# Patient Record
Sex: Female | Born: 1970 | Race: White | Hispanic: No | Marital: Married | Smoking: Former smoker
Health system: Southern US, Community
[De-identification: ages and names within clinical notes are randomized; demographics above are authoritative.]

## PROBLEM LIST (undated history)

## (undated) DIAGNOSIS — E7800 Pure hypercholesterolemia, unspecified: Secondary | ICD-10-CM

## (undated) DIAGNOSIS — M2550 Pain in unspecified joint: Secondary | ICD-10-CM

## (undated) DIAGNOSIS — Z800 Family history of malignant neoplasm of digestive organs: Secondary | ICD-10-CM

## (undated) DIAGNOSIS — Z8042 Family history of malignant neoplasm of prostate: Secondary | ICD-10-CM

## (undated) DIAGNOSIS — T8859XA Other complications of anesthesia, initial encounter: Secondary | ICD-10-CM

## (undated) DIAGNOSIS — L111 Transient acantholytic dermatosis [Grover]: Secondary | ICD-10-CM

## (undated) DIAGNOSIS — E559 Vitamin D deficiency, unspecified: Secondary | ICD-10-CM

## (undated) DIAGNOSIS — Z8489 Family history of other specified conditions: Secondary | ICD-10-CM

## (undated) HISTORY — DX: Vitamin D deficiency, unspecified: E55.9

## (undated) HISTORY — DX: Transient acantholytic dermatosis (grover): L11.1

## (undated) HISTORY — DX: Pain in unspecified joint: M25.50

## (undated) HISTORY — DX: Family history of malignant neoplasm of prostate: Z80.42

## (undated) HISTORY — DX: Family history of malignant neoplasm of digestive organs: Z80.0

## (undated) HISTORY — DX: Pure hypercholesterolemia, unspecified: E78.00

---

## 2020-12-30 ENCOUNTER — Other Ambulatory Visit: Payer: Self-pay | Admitting: Family Medicine

## 2020-12-30 DIAGNOSIS — R928 Other abnormal and inconclusive findings on diagnostic imaging of breast: Secondary | ICD-10-CM

## 2021-01-01 DIAGNOSIS — C801 Malignant (primary) neoplasm, unspecified: Secondary | ICD-10-CM

## 2021-01-01 HISTORY — DX: Malignant (primary) neoplasm, unspecified: C80.1

## 2021-01-05 ENCOUNTER — Other Ambulatory Visit: Payer: Self-pay

## 2021-01-05 ENCOUNTER — Ambulatory Visit
Admission: RE | Admit: 2021-01-05 | Discharge: 2021-01-05 | Disposition: A | Discharge status: Home / Self Care | Payer: BC Managed Care – PPO | Source: Ambulatory Visit | Attending: Family Medicine | Admitting: Family Medicine

## 2021-01-05 DIAGNOSIS — R928 Other abnormal and inconclusive findings on diagnostic imaging of breast: Secondary | ICD-10-CM

## 2021-01-06 ENCOUNTER — Encounter: Payer: Self-pay | Admitting: *Deleted

## 2021-01-10 ENCOUNTER — Encounter: Payer: Self-pay | Admitting: *Deleted

## 2021-01-10 ENCOUNTER — Other Ambulatory Visit: Payer: Self-pay | Admitting: *Deleted

## 2021-01-10 DIAGNOSIS — Z170 Estrogen receptor positive status [ER+]: Secondary | ICD-10-CM

## 2021-01-10 DIAGNOSIS — C50412 Malignant neoplasm of upper-outer quadrant of left female breast: Secondary | ICD-10-CM

## 2021-01-11 ENCOUNTER — Encounter: Payer: Self-pay | Admitting: *Deleted

## 2021-01-11 ENCOUNTER — Ambulatory Visit
Admission: RE | Admit: 2021-01-11 | Discharge: 2021-01-11 | Disposition: A | Discharge status: Home / Self Care | Payer: BC Managed Care – PPO | Source: Ambulatory Visit | Attending: Radiation Oncology | Admitting: Radiation Oncology

## 2021-01-11 ENCOUNTER — Encounter: Payer: Self-pay | Admitting: Radiation Oncology

## 2021-01-11 ENCOUNTER — Inpatient Hospital Stay: Payer: BC Managed Care – PPO | Attending: Hematology | Admitting: Hematology

## 2021-01-11 ENCOUNTER — Inpatient Hospital Stay: Payer: BC Managed Care – PPO

## 2021-01-11 ENCOUNTER — Encounter: Payer: Self-pay | Admitting: General Practice

## 2021-01-11 ENCOUNTER — Other Ambulatory Visit: Payer: Self-pay | Admitting: Surgery

## 2021-01-11 ENCOUNTER — Encounter: Payer: Self-pay | Admitting: Hematology

## 2021-01-11 ENCOUNTER — Other Ambulatory Visit: Payer: Self-pay

## 2021-01-11 ENCOUNTER — Ambulatory Visit: Payer: BC Managed Care – PPO | Attending: Surgery | Admitting: Physical Therapy

## 2021-01-11 ENCOUNTER — Ambulatory Visit (HOSPITAL_BASED_OUTPATIENT_CLINIC_OR_DEPARTMENT_OTHER): Payer: BC Managed Care – PPO | Admitting: Genetic Counselor

## 2021-01-11 ENCOUNTER — Encounter: Payer: Self-pay | Admitting: Physical Therapy

## 2021-01-11 VITALS — BP 112/60 | HR 83 | Temp 97.7°F | Resp 16 | Ht 67.0 in | Wt 189.9 lb

## 2021-01-11 DIAGNOSIS — C50412 Malignant neoplasm of upper-outer quadrant of left female breast: Secondary | ICD-10-CM | POA: Insufficient documentation

## 2021-01-11 DIAGNOSIS — Z853 Personal history of malignant neoplasm of breast: Secondary | ICD-10-CM

## 2021-01-11 DIAGNOSIS — Z8042 Family history of malignant neoplasm of prostate: Secondary | ICD-10-CM

## 2021-01-11 DIAGNOSIS — Z975 Presence of (intrauterine) contraceptive device: Secondary | ICD-10-CM | POA: Diagnosis not present

## 2021-01-11 DIAGNOSIS — Z800 Family history of malignant neoplasm of digestive organs: Secondary | ICD-10-CM

## 2021-01-11 DIAGNOSIS — Z170 Estrogen receptor positive status [ER+]: Secondary | ICD-10-CM

## 2021-01-11 DIAGNOSIS — R293 Abnormal posture: Secondary | ICD-10-CM | POA: Diagnosis present

## 2021-01-11 LAB — CBC WITH DIFFERENTIAL (CANCER CENTER ONLY)
Abs Immature Granulocytes: 0.02 10*3/uL (ref 0.00–0.07)
Basophils Absolute: 0.1 10*3/uL (ref 0.0–0.1)
Basophils Relative: 1 %
Eosinophils Absolute: 0.2 10*3/uL (ref 0.0–0.5)
Eosinophils Relative: 4 %
HCT: 41 % (ref 36.0–46.0)
Hemoglobin: 13.4 g/dL (ref 12.0–15.0)
Immature Granulocytes: 0 %
Lymphocytes Relative: 23 %
Lymphs Abs: 1.2 10*3/uL (ref 0.7–4.0)
MCH: 30.4 pg (ref 26.0–34.0)
MCHC: 32.7 g/dL (ref 30.0–36.0)
MCV: 93 fL (ref 80.0–100.0)
Monocytes Absolute: 0.3 10*3/uL (ref 0.1–1.0)
Monocytes Relative: 7 %
Neutro Abs: 3.2 10*3/uL (ref 1.7–7.7)
Neutrophils Relative %: 65 %
Platelet Count: 268 10*3/uL (ref 150–400)
RBC: 4.41 MIL/uL (ref 3.87–5.11)
RDW: 12.5 % (ref 11.5–15.5)
WBC Count: 5 10*3/uL (ref 4.0–10.5)
nRBC: 0 % (ref 0.0–0.2)

## 2021-01-11 LAB — CMP (CANCER CENTER ONLY)
ALT: 9 U/L (ref 0–44)
AST: 14 U/L — ABNORMAL LOW (ref 15–41)
Albumin: 4.6 g/dL (ref 3.5–5.0)
Alkaline Phosphatase: 66 U/L (ref 38–126)
Anion gap: 11 (ref 5–15)
BUN: 10 mg/dL (ref 6–20)
CO2: 27 mmol/L (ref 22–32)
Calcium: 9.5 mg/dL (ref 8.9–10.3)
Chloride: 103 mmol/L (ref 98–111)
Creatinine: 0.72 mg/dL (ref 0.44–1.00)
GFR, Estimated: >60 mL/min (ref >60)
Glucose, Bld: 122 mg/dL — ABNORMAL HIGH (ref 70–99)
Potassium: 3.4 mmol/L — ABNORMAL LOW (ref 3.5–5.1)
Sodium: 141 mmol/L (ref 135–145)
Total Bilirubin: 0.7 mg/dL (ref 0.3–1.2)
Total Protein: 7.7 g/dL (ref 6.5–8.1)

## 2021-01-11 LAB — GENETIC SCREENING ORDER

## 2021-01-11 NOTE — Progress Notes (Signed)
Radiation Oncology         [phone] ________________________________  Name: Brandi Phelps        MRN: [id number]  Date of Service: 01/11/2021 DOB: 17-Jul-1971  [id number], Brandi Sarna, FNP  Coralie Keens, MD     REFERRING PHYSICIAN: Coralie Keens, MD   DIAGNOSIS: The encounter diagnosis was Malignant neoplasm of upper-outer quadrant of left breast in female, estrogen receptor positive ([place]).   HISTORY OF PRESENT ILLNESS: Brandi Phelps is a 50 y.o. female seen in the multidisciplinary breast clinic for a new diagnosis of left breast cancer. The patient was noted to have screening detected mass in the 1:30 position. Diagnostic imaging revealed this to measure 8 mm and her axilla was negative. 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 Her cancer was ER/PR positive, and Her2 was negative, Ki 67 was 10%. She's seen today to discuss treatment of her cancer.    PREVIOUS RADIATION THERAPY: No   PAST MEDICAL HISTORY:  Past Medical History:  Diagnosis Date  . Grover's disease        PAST SURGICAL HISTORY:  C-section x3  FAMILY HISTORY:  Family History  Problem Relation Age of Onset  . Prostate cancer Father   . Myelodysplastic syndrome Father   . Prostate cancer Paternal Uncle      SOCIAL HISTORY:  reports that she quit smoking about 27 years ago. She does not have any smokeless tobacco history on file. She reports previous alcohol use. The patient is married and lives in [place]. 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 She has three teenage children. Her husband was previously treated with H&N cancer at [facility].   ALLERGIES: Methotrexate derivatives   MEDICATIONS:  Current Outpatient Medications  Medication Sig Dispense Refill  . ibuprofen (ADVIL) 200 MG tablet Take 200 mg by mouth every 6 (six) hours as needed.    Marland Kitchen levonorgestrel (MIRENA, 52 MG,) 20 MCG/DAY IUD Mirena     No current  facility-administered medications for this encounter.     REVIEW OF SYSTEMS: On review of systems, the patient reports that she is doing well overall. She denies any recent Grover's skin eruptions. No specific breast complaints are noted.      PHYSICAL EXAM:  Wt Readings from Last 3 Encounters:  01/11/21 189 lb 14.4 oz (86.1 kg)   Temp Readings from Last 3 Encounters:  01/11/21 97.7 F (36.5 C)   BP Readings from Last 3 Encounters:  01/11/21 112/60   Pulse Readings from Last 3 Encounters:  01/11/21 83    In general this is a well appearing caucasian female in no acute distress. She's alert and oriented x4 and appropriate throughout the examination. Cardiopulmonary assessment is negative for acute distress and she exhibits normal effort. Bilateral breast exam is deferred.    ECOG = 0  0 - Asymptomatic (Fully active, able to carry on all predisease activities without restriction)  1 - Symptomatic but completely ambulatory (Restricted in physically strenuous activity but ambulatory and able to carry out work of a light or sedentary nature. For example, light housework, office work)  2 - Symptomatic, <50% in bed during the day (Ambulatory and capable of all self care but unable to carry out any work activities. Up and about more than 50% of waking hours)  3 - Symptomatic, >50% in bed, but not bedbound (Capable of only limited self-care, confined to bed or chair 50% or more of waking hours)  4 - Bedbound (Completely disabled. Cannot carry on any self-care. Totally confined to bed or chair)  5 - Death   Eustace Pen MM, Creech RH, Tormey DC, et al. [phone]). "Toxicity and response criteria of the [facility]". [place] Oncol. 5 (6): 649-55    LABORATORY DATA:  Lab Results  Component Value Date   WBC 5.0 01/11/2021   HGB 13.4 01/11/2021   HCT 41.0 01/11/2021   MCV 93.0 01/11/2021   PLT 268 01/11/2021   Lab Results  Component Value Date   NA 141  01/11/2021   K 3.4 (L) 01/11/2021   CL 103 01/11/2021   CO2 27 01/11/2021   Lab Results  Component Value Date   ALT 9 01/11/2021   AST 14 (L) 01/11/2021   ALKPHOS 66 01/11/2021   BILITOT 0.7 01/11/2021      RADIOGRAPHY: MM CLIP PLACEMENT LEFT  Result Date: 01/05/2021 CLINICAL DATA:  Patient status post ultrasound-guided biopsy left breast mass. EXAM: DIAGNOSTIC LEFT MAMMOGRAM POST ULTRASOUND BIOPSY COMPARISON:  Previous exam(s). FINDINGS: Mammographic images were obtained following ultrasound guided biopsy of left breast mass 1:30 o'clock. The biopsy marking clip is in expected position at the site of biopsy. IMPRESSION: Appropriate positioning of the ribbon shaped biopsy marking clip at the site of biopsy in the left breast mass 1:30 o'clock. Final Assessment: Post Procedure Mammograms for Marker Placement Electronically Signed   By: Lovey Newcomer M.D.   On: 01/05/2021 13:49   [place] LT BREAST BX W LOC DEV 1ST LESION IMG BX SPEC US GUIDE  Addendum Date: 01/06/2021   ADDENDUM REPORT: 01/06/2021 14:06 ADDENDUM: Pathology revealed GRADE I/II INVASIVE DUCTAL CARCINOMA, DUCTAL CARCINOMA IN SITU of the LEFT breast, upper outer. This was found to be concordant by Dr. Lovey Newcomer. Pathology results were discussed with the patient by telephone. The patient reported doing well after the biopsy with tenderness at the site. Post biopsy instructions and care were reviewed and questions were answered. The patient was encouraged to call [facility] for any additional concerns. The patient was referred to [facility] [place] [facility] at [facility] on Jan 11, 2021. Pathology results reported by Stacie Acres RN on 01/06/2021. Electronically Signed   By: Lovey Newcomer M.D.   On: 01/06/2021 14:06   Result Date: 01/06/2021 CLINICAL DATA:  Patient with indeterminate left breast mass. EXAM: ULTRASOUND GUIDED LEFT BREAST CORE NEEDLE BIOPSY COMPARISON:   Previous exam(s). PROCEDURE: I met with the patient and we discussed the procedure of ultrasound-guided biopsy, including benefits and alternatives. We discussed the high likelihood of a successful procedure. We discussed the risks of the procedure, including infection, bleeding, tissue injury, clip migration, and inadequate sampling. Informed written consent was given. The usual time-out protocol was performed immediately prior to the procedure. Lesion quadrant: Upper outer quadrant Using sterile technique and 1% Lidocaine as local anesthetic, under direct ultrasound visualization, a 14 gauge spring-loaded device was used to perform biopsy of left breast mass 1:30 o'clock using a lateral approach. At the conclusion of the procedure ribbon shaped tissue marker clip was deployed into the biopsy cavity. Follow up 2 view mammogram was performed and dictated separately. IMPRESSION: Ultrasound guided biopsy of left breast mass 1:30 o'clock. No apparent complications. Electronically Signed: By: Lovey Newcomer M.D. On: 01/05/2021 13:51       IMPRESSION/PLAN: 1. 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 Dr. Lisbeth Renshaw discusses the  pathology findings and reviews the nature of early stage breast disease. The consensus from the breast conference includes breast conservation with lumpectomy with sentinel node biopsy. Depending on the size of the final tumor measurements rendered by pathology, the tumor may be tested for Oncotype Dx score to determine a role for systemic therapy. Provided that chemotherapy is not indicated, the patient's course would then be followed by external radiotherapy to the breast  to reduce risks of local recurrence followed by antiestrogen therapy. We discussed the risks, benefits, short, and long term effects of radiotherapy, as well as the curative intent, and the patient is interested in proceeding. Dr. Lisbeth Renshaw discusses the delivery and logistics of  radiotherapy and anticipates a course of 4 or up to 6 1/2 weeks of radiotherapy. We will see her back a few weeks after surgery to discuss the simulation process and anticipate we starting radiotherapy about 4-6 weeks after surgery.  2. Possible genetic predisposition to malignancy. The patient is a candidate for genetic testing given her personal and family history. She was offered referral and will be referred to discuss testing with genetics. 3. Contraceptive Counseling. The patient was told last summer she still has ovarian function. She's due for a new Mirena IUD. She will discuss this further with Dr. Burr Medico and is aware of the need to avoid pregnancy during radiotherapy. 4. Grover's Disease. The patient is aware that this is not a known condition that would prohibit the use of radiotherapy. She will keep [place] informed of concerns with her skin prior to treatment.   In a visit lasting 60 minutes, greater than 50% of the time was spent face to face reviewing her case, as well as in preparation of, discussing, and coordinating the patient's care.  The above documentation reflects my direct findings during this shared patient visit. Please see the separate note by Dr. Lisbeth Renshaw on this date for the remainder of the patient's plan of care.    Carola Rhine, [facility]    **Disclaimer: This note was dictated with voice recognition software. Similar sounding words can inadvertently be transcribed and this note may contain transcription errors which may not have been corrected upon publication of note.**

## 2021-01-11 NOTE — Therapy (Signed)
[place], [place], [address] Phone: [phone]   Fax:  [phone]  Physical Therapy Evaluation  Patient Details  Name: Brandi Phelps MRN: [id number] Date of Birth: June 13, 1971 Referring Provider (PT): Dr. Coralie Keens   Encounter Date: 01/11/2021   PT End of Session - 01/11/21 1526    Visit Number 1    Number of Visits 2    Date for PT Re-Evaluation 03/08/21    PT Start Time 1316    PT Stop Time 1346    PT Time Calculation (min) 30 min    Activity Tolerance Patient tolerated treatment well    Behavior During Therapy [facility] for tasks assessed/performed           Past Medical History:  Diagnosis Date  . Grover's disease     Past Surgical History:  Procedure Laterality Date  . CESAREAN SECTION      There were no vitals filed for this visit.    Subjective Assessment - 01/11/21 1520    Subjective Patient reports she is here today to be seen by her medical team for her newly diagnosed left breast cancer.    Patient is accompained by: Family member    Pertinent History Patient was diagnosed on 12/19/2020 with 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 It measures 8 mm and is located in the upper outer quadrant. It is ER/PR positive and HER2 negative with a Ki67 of 10%.    Patient Stated Goals Reduce lymphedema risk and learn post op shoulder ROM HEP    Currently in Pain? No/denies              [facility] PT Assessment - 01/11/21 0001      Assessment   Medical Diagnosis Left breast cancer    Referring Provider (PT) Dr. Coralie Keens    Onset Date/Surgical Date 12/19/20    Hand Dominance Right    Prior Therapy none      Precautions   Precautions Other (comment)    Precaution Comments active cancer      Restrictions   Weight Bearing Restrictions No      Balance Screen   Has the patient fallen in the past 6 months No    Has the patient had a decrease in activity  level because of a fear of falling?  No    Is the patient reluctant to leave their home because of a fear of falling?  No      Home Social worker Private residence    Living Arrangements Spouse/significant other;Children   Husband, 24, 26, and 42 y.o. kids   Available Help at Discharge Family      Prior Function   Level of Independence Independent    Vocation Full time employment    Vocation Requirements K/1 grade teacher    Leisure She doe not regularly exercise      Cognition   Overall Cognitive Status Within Functional Limits for tasks assessed      Posture/Postural Control   Posture/Postural Control Postural limitations    Postural Limitations Rounded Shoulders;Forward head      ROM / Strength   AROM / PROM / Strength AROM;Strength      AROM   Overall AROM Comments Cervical AROM is all WNL    AROM Assessment Site Shoulder    Right/Left Shoulder Right;Left    Right Shoulder Extension 71 Degrees    Right Shoulder Flexion 149 Degrees  Right Shoulder ABduction 158 Degrees    Right Shoulder Internal Rotation 73 Degrees    Right Shoulder External Rotation 88 Degrees    Left Shoulder Extension 73 Degrees    Left Shoulder Flexion 140 Degrees    Left Shoulder ABduction 155 Degrees    Left Shoulder Internal Rotation 73 Degrees    Left Shoulder External Rotation 82 Degrees      Strength   Overall Strength Within functional limits for tasks performed             LYMPHEDEMA/ONCOLOGY QUESTIONNAIRE - 01/11/21 0001      Type   Cancer Type Left breast cancer      Lymphedema Assessments   Lymphedema Assessments Upper extremities      Right Upper Extremity Lymphedema   10 cm Proximal to Olecranon Process 29 cm    Olecranon Process 24.6 cm    10 cm Proximal to Ulnar Styloid Process 23.8 cm    Just Proximal to Ulnar Styloid Process 15.9 cm    Across Hand at [employer] 19.8 cm    At [place] of 2nd Digit 6.1 cm      Left Upper Extremity Lymphedema    10 cm Proximal to Olecranon Process 28.6 cm    Olecranon Process 25 cm    10 cm Proximal to Ulnar Styloid Process 23 cm    Just Proximal to Ulnar Styloid Process 15.7 cm    Across Hand at [employer] 20.3 cm    At [place] of 2nd Digit 5.8 cm           L-DEX FLOWSHEETS - 01/11/21 1500      L-DEX LYMPHEDEMA SCREENING   Measurement Type Unilateral    L-DEX MEASUREMENT EXTREMITY Upper Extremity    POSITION  Standing    DOMINANT SIDE Right    At Risk Side Left    BASELINE SCORE (UNILATERAL) -0.6           The patient was assessed using the L-Dex machine today to produce a lymphedema index baseline score. The patient will be reassessed on a regular basis (typically every 3 months) to obtain new L-Dex scores. If the score is > 6.5 points away from his/her baseline score indicating onset of subclinical lymphedema, it will be recommended to wear a compression garment for 4 weeks, 12 hours per day and then be reassessed. If the score continues to be > 6.5 points from baseline at reassessment, we will initiate lymphedema treatment. Assessing in this manner has a 95% rate of preventing clinically significant lymphedema.      Katina Dung - 01/11/21 0001    Open a tight or new jar No difficulty    Do heavy household chores (wash walls, wash floors) No difficulty    Carry a shopping bag or briefcase No difficulty    Wash your back No difficulty    Use a knife to cut food No difficulty    Recreational activities in which you take some force or impact through your arm, shoulder, or hand (golf, hammering, tennis) No difficulty    During the past week, to what extent has your arm, shoulder or hand problem interfered with your normal social activities with family, friends, neighbors, or groups? Not at all    During the past week, to what extent has your arm, shoulder or hand problem limited your work or other regular daily activities Not at all    Arm, shoulder, or hand pain. None    Tingling  (  pins and needles) in your arm, shoulder, or hand None    Difficulty Sleeping No difficulty    DASH Score 0 %            Objective measurements completed on examination: See above findings.       Patient was instructed today in a home exercise program today for post op shoulder range of motion. These included active assist shoulder flexion in sitting, scapular retraction, wall walking with shoulder abduction, and hands behind head external rotation.  She was encouraged to do these twice a day, holding 3 seconds and repeating 5 times when permitted by her physician.            PT Education - 01/11/21 1526    Education Details Lymphedema risk reduction and post op shoulder ROM HEP    Person(s) Educated Patient;Spouse    Methods Explanation;Demonstration;Handout    Comprehension Verbalized understanding;Returned demonstration               PT Long Term Goals - 01/11/21 1533      PT LONG TERM GOAL #1   Title Patient will demonstrate she has regained full shoulder ROM and function post operatively compared to baselines.    Time 8    Period Weeks    Status New    Target Date 03/08/21           Breast Clinic Goals - 01/11/21 1532      Patient will be able to verbalize understanding of pertinent lymphedema risk reduction practices relevant to her diagnosis specifically related to skin care.   Time 1    Period Days    Status Achieved      Patient will be able to return demonstrate and/or verbalize understanding of the post-op home exercise program related to regaining shoulder range of motion.   Time 1    Period Days    Status Achieved      Patient will be able to verbalize understanding of the importance of attending the postoperative After Breast Cancer Class for further lymphedema risk reduction education and therapeutic exercise.   Time 1    Period Days    Status Achieved                 Plan - 01/11/21 1527    Clinical Impression Statement  Patient was diagnosed on 12/19/2020 with 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 It measures 8 mm and is located in the upper outer quadrant. It is ER/PR positive and HER2 negative with a Ki67 of 10%. Her multidisciplinary medical team met prior to her medical assessments to determine a recommended treatment plan. She is planning to have a left lumpectomy and sentinel node biopsy followed by Oncotype testing, radiation, and anti-estrogen therapy. She will benefit from a post op PT reassessment to determine needs and from L-Dex screens every 3 months for 2 years to detect subclinical lymphedema.    Stability/Clinical Decision Making Stable/Uncomplicated    Clinical Decision Making Low    Rehab Potential Excellent    PT Frequency --   Eval and 1 f/u visit   PT Treatment/Interventions ADLs/Self Care Home Management;Therapeutic exercise;Patient/family education    PT Next Visit Plan Will reassess 3-4 weeks post op to determine needs    PT Home Exercise Plan Post op shoulder ROM HEP    Consulted and Agree with Plan of Care Patient;Family member/caregiver    Family Member Consulted Husband  Patient will benefit from skilled therapeutic intervention in order to improve the following deficits and impairments:  Postural dysfunction,Decreased range of motion,Decreased knowledge of precautions,Impaired UE functional use,Pain  Visit Diagnosis: Malignant neoplasm of upper-outer quadrant of left breast in female, estrogen receptor positive ([place]) - Plan: PT plan of care cert/re-cert  Abnormal posture - Plan: PT plan of care cert/re-cert   Patient will follow up at outpatient cancer rehab 3-4 weeks following surgery.  If the patient requires physical therapy at that time, a specific plan will be dictated and sent to the referring physician for approval. The patient was educated today on appropriate basic range of motion exercises to begin post operatively and the importance of  attending the After Breast Cancer class following surgery.  Patient was educated today on lymphedema risk reduction practices as it pertains to recommendations that will benefit the patient immediately following surgery.  She verbalized good understanding.     Problem List Patient Active Problem List   Diagnosis Date Noted  . Malignant neoplasm of upper-outer quadrant of left breast in female, estrogen receptor positive ([place]) 01/10/2021   Annia Friendly, PT 01/11/21 3:35 PM  [place], [place], [address] Phone: [phone]   Fax:  [phone]  Name: Brandi Phelps MRN: [id number] Date of Birth: 11-Jan-1971

## 2021-01-11 NOTE — Patient Instructions (Signed)

## 2021-01-11 NOTE — Progress Notes (Signed)
[place]   [phone] Fax:([phone]) [place] Note   Patient Care Team: [name], FNP as PCP - General (Family Medicine) Mauro Kaufmann, RN as Oncology Nurse Navigator Rockwell Germany, RN as Oncology Nurse Navigator Coralie Keens, MD as Consulting Physician (General Surgery) Truitt Merle, MD as Consulting Physician (Hematology) Kyung Rudd, MD as Consulting Physician (Radiation Oncology)  Date of Service:  01/11/2021   CHIEF COMPLAINTS/PURPOSE OF CONSULTATION:  Newly Diagnosed Malignant neoplasm of upper-outer quadrant of left breast    Oncology History Overview Note  Cancer Staging No matching staging information was found for the patient.    Malignant neoplasm of upper-outer quadrant of left breast in female, estrogen receptor positive ([place])  01/05/2021 Initial Biopsy   Diagnosis Breast, left, needle core biopsy, upper outer - INVASIVE DUCTAL CARCINOMA, GRADE 1/2. - DUCTAL CARCINOMA IN SITU. Microscopic Comment The greatest tumor dimension is 0.8 cm. A breast prognostic profile will be performed. Dr. Tresa Moore agrees.   01/05/2021 Receptors her2   PROGNOSTIC INDICATORS Results: IMMUNOHISTOCHEMICAL AND MORPHOMETRIC ANALYSIS PERFORMED MANUALLY The tumor cells are NEGATIVE for Her2 (1+). Estrogen Receptor: 85%, POSITIVE, MODERATE STAINING INTENSITY Progesterone Receptor: 95%, POSITIVE, STRONG STAINING INTENSITY Proliferation Marker Ki67: 10%   01/05/2021 Mammogram   Mammogram  Left breast mass 1:30 position   01/10/2021 Initial Diagnosis   Malignant neoplasm of upper-outer quadrant of left breast in female, estrogen receptor positive ([place])      HISTORY OF PRESENTING ILLNESS:  Brandi Phelps 50 y.o. female is a here because of newly diagnosed left breast cancer. The patient presents to the clinic today accompanied by her husband  She notes her left breast mass was found on mammogram. She did not feel the mass  herself. She notes she had abnormal mammogram several years ago but was benign, without biopsy.  Today she denies breast changes. She does not recent UTI and after changing her liquid intake to more water, her UTI symptoms stopped. She denies any other medication issues.   She does not have any significant PMHx. I reviewed her medication list with her. She has had C-section. She notes her father had prostate cancer in his 67s. She notes her Cholesterol has been elevated and does not want to use medication if possible. She notes she has Mirena which is currently in place but due for replacement. She notes her labs in summer 2021 assumed she was not postmenopausal yet.   Socially she is married with 3 children. She works as a Pharmacist, hospital. She smoked in College for 4-5 years, less than 1ppd. She is not really drinking now.     GYN HISTORY  Menarchal:11 LMP: 2009 Contraceptive: IUD Mirena from 1993-2008. She currently has Mirena  HRT: No G3P: first at age 30    REVIEW OF SYSTEMS:   Constitutional: Denies fevers, chills or abnormal night sweats Eyes: Denies blurriness of vision, double vision or watery eyes Ears, nose, mouth, throat, and face: Denies mucositis or sore throat Respiratory: Denies cough, dyspnea or wheezes Cardiovascular: Denies palpitation, chest discomfort or lower extremity swelling Gastrointestinal:  Denies nausea, heartburn or change in bowel habits Skin: Denies abnormal skin rashes Lymphatics: Denies new lymphadenopathy or easy bruising Neurological:Denies numbness, tingling or new weaknesses Behavioral/Psych: Mood is stable, no new changes  All other systems were reviewed with the patient and are negative.   MEDICAL HISTORY:  Past Medical History:  Diagnosis Date  . Grover's disease     SURGICAL HISTORY: Past Surgical  History:  Procedure Laterality Date  . CESAREAN SECTION      SOCIAL HISTORY: Social History   Socioeconomic History  . Marital status:  Married    Spouse name: Not on file  . Number of children: Not on file  . Years of education: Not on file  . Highest education level: Not on file  Occupational History  . Not on file  Tobacco Use  . Smoking status: Former Smoker    Packs/day: 0.50    Years: 5.00    Pack years: 2.50    Quit date: 01/11/1994    Years since quitting: 27.0  . Smokeless tobacco: Not on file  Substance and Sexual Activity  . Alcohol use: Not Currently  . Drug use: Not on file  . Sexual activity: Not on file  Other Topics Concern  . Not on file  Social History Narrative  . Not on file   Social Determinants of Health   Financial Resource Strain: Not on file  Food Insecurity: Not on file  Transportation Needs: Not on file  Physical Activity: Not on file  Stress: Not on file  Social Connections: Not on file  Intimate Partner Violence: Not on file    FAMILY HISTORY: Family History  Problem Relation Age of Onset  . Prostate cancer Father 7  . Myelodysplastic syndrome Father   . Prostate cancer Paternal Uncle     ALLERGIES:  is allergic to methotrexate derivatives.  MEDICATIONS:  Current Outpatient Medications  Medication Sig Dispense Refill  . ibuprofen (ADVIL) 200 MG tablet Take 200 mg by mouth every 6 (six) hours as needed.    Marland Kitchen levonorgestrel (MIRENA, 52 MG,) 20 MCG/DAY IUD Mirena     No current facility-administered medications for this visit.    PHYSICAL EXAMINATION: ECOG PERFORMANCE STATUS: 0 - Asymptomatic  Vitals:   01/11/21 1258  BP: 112/60  Pulse: 83  Resp: 16  Temp: 97.7 F (36.5 C)  SpO2: 100%   Filed Weights   01/11/21 1258  Weight: 189 lb 14.4 oz (86.1 kg)    GENERAL:alert, no distress and comfortable SKIN: skin color, texture, turgor are normal, no rashes or significant lesions EYES: normal, Conjunctiva are pink and non-injected, sclera clear  NECK: supple, thyroid normal size, non-tender, without nodularity LYMPH:  no palpable lymphadenopathy in the  cervical, axillary  LUNGS: clear to auscultation and percussion with normal breathing effort HEART: regular rate & rhythm and no murmurs and no lower extremity edema ABDOMEN:abdomen soft, non-tender and normal bowel sounds Musculoskeletal:no cyanosis of digits and no clubbing  NEURO: alert & oriented x 3 with fluent speech, no focal motor/sensory deficits BREAST: (+) Mild skin ecchymosis of left breast at biopsy site. No palpable mass, nodules or adenopathy bilaterally. Breast exam benign.  LABORATORY DATA:  I have reviewed the data as listed CBC Latest Ref Rng & Units 01/11/2021  WBC 4.0 - 10.5 K/uL 5.0  Hemoglobin 12.0 - 15.0 g/dL 13.4  Hematocrit 36.0 - 46.0 % 41.0  Platelets 150 - 400 K/uL 268    CMP Latest Ref Rng & Units 01/11/2021  Glucose 70 - 99 mg/dL 122(H)  BUN 6 - 20 mg/dL 10  Creatinine 0.44 - 1.00 mg/dL 0.72  Sodium 135 - 145 mmol/L 141  Potassium 3.5 - 5.1 mmol/L 3.4(L)  Chloride 98 - 111 mmol/L 103  CO2 22 - 32 mmol/L 27  Calcium 8.9 - 10.3 mg/dL 9.5  Total Protein 6.5 - 8.1 g/dL 7.7  Total Bilirubin 0.3 - 1.2 mg/dL 0.7  Alkaline Phos 38 - 126 U/L 66  AST 15 - 41 U/L 14(L)  ALT 0 - 44 U/L 9     RADIOGRAPHIC STUDIES: I have personally reviewed the radiological images as listed and agreed with the findings in the report. MM CLIP PLACEMENT LEFT  Result Date: 01/05/2021 CLINICAL DATA:  Patient status post ultrasound-guided biopsy left breast mass. EXAM: DIAGNOSTIC LEFT MAMMOGRAM POST ULTRASOUND BIOPSY COMPARISON:  Previous exam(s). FINDINGS: Mammographic images were obtained following ultrasound guided biopsy of left breast mass 1:30 o'clock. The biopsy marking clip is in expected position at the site of biopsy. IMPRESSION: Appropriate positioning of the ribbon shaped biopsy marking clip at the site of biopsy in the left breast mass 1:30 o'clock. Final Assessment: Post Procedure Mammograms for Marker Placement Electronically Signed   By: Lovey Newcomer M.D.   On:  01/05/2021 13:49   [place] LT BREAST BX W LOC DEV 1ST LESION IMG BX SPEC US GUIDE  Addendum Date: 01/06/2021   ADDENDUM REPORT: 01/06/2021 14:06 ADDENDUM: Pathology revealed GRADE I/II INVASIVE DUCTAL CARCINOMA, DUCTAL CARCINOMA IN SITU of the LEFT breast, upper outer. This was found to be concordant by Dr. Lovey Newcomer. Pathology results were discussed with the patient by telephone. The patient reported doing well after the biopsy with tenderness at the site. Post biopsy instructions and care were reviewed and questions were answered. The patient was encouraged to call [facility] for any additional concerns. The patient was referred to [facility] on Jan 11, 2021. Pathology results reported by Stacie Acres RN on 01/06/2021. Electronically Signed   By: Lovey Newcomer M.D.   On: 01/06/2021 14:06   Result Date: 01/06/2021 CLINICAL DATA:  Patient with indeterminate left breast mass. EXAM: ULTRASOUND GUIDED LEFT BREAST CORE NEEDLE BIOPSY COMPARISON:  Previous exam(s). PROCEDURE: I met with the patient and we discussed the procedure of ultrasound-guided biopsy, including benefits and alternatives. We discussed the high likelihood of a successful procedure. We discussed the risks of the procedure, including infection, bleeding, tissue injury, clip migration, and inadequate sampling. Informed written consent was given. The usual time-out protocol was performed immediately prior to the procedure. Lesion quadrant: Upper outer quadrant Using sterile technique and 1% Lidocaine as local anesthetic, under direct ultrasound visualization, a 14 gauge spring-loaded device was used to perform biopsy of left breast mass 1:30 o'clock using a lateral approach. At the conclusion of the procedure ribbon shaped tissue marker clip was deployed into the biopsy cavity. Follow up 2 view mammogram was performed and dictated separately. IMPRESSION:  Ultrasound guided biopsy of left breast mass 1:30 o'clock. No apparent complications. Electronically Signed: By: Lovey Newcomer M.D. On: 01/05/2021 13:51    ASSESSMENT & PLAN:  Brandi Phelps is a 50 y.o. Caucasian female with    1. Malignant neoplasm of upper-outer quadrant of left breast, Stage IA, cT1bN0M0, stage IA, ER+/PR+/HER2-, Grade I/II  -We discussed her image findings and the biopsy results in great details. She was found to have a 0.8cm left breast mass with biopsy confirmed invasive ductal carcinoma and components of DCIS.  -Given the early stage disease, she likely need a left lumpectomy with sentinel LN biopsy. She is agreeable with that. She was seen by Dr. Ninfa Linden today and likely will proceed with surgery soon.  -I recommend a Oncotype Dx test on the surgical sample and we'll make a decision about adjuvant chemotherapy based on the Oncotype result. Written material  of this test was given to her. She is young and fit, would be a good candidate for chemotherapy if her Oncotype recurrence score is high. -If her surgical sentinel lymph node positive, I recommend mammaprint for further risk stratification and guide adjuvant chemotherapy. -The risk of recurrence depends on the stage and biology of the tumor. She is early stage, with ER/PR positive and HER2 negative markers. I discussed this is the more common type of slow growing tumor, and I anticipate her risk of recurrence is likely low  -She was also seen by radiation oncologist Dr. Lisbeth Renshaw today. Adjuvant radiation is recommended after lumpectomy to reduce her risk of local recurrence.   -Given the strong ER and PR expression and perimenopausal status, I recommend Tamoxifen for 10 years. May recommend switching to Aromatase inhibitor when she becomes postmenopausal. Potential benefits and side effects were discussed with patient and she is interested. -We also discussed the breast cancer surveillance after her surgery. She will  continue annual screening mammogram, self exam, and a routine office visit with lab and exam with [place]. -Labs reviewed, CBC and CMP WNL except K 3.4 and BG 122. Physical exam benign.  -Proceed with surgery. I will f/u with her after surgery or Radiation.    2. IUD -She has had IUD Mirena in place since 1993 as needed. She currently has one in place and notes it is time for replacement. She has been having UTI symptoms and vaginal spotting.  -I discussed her breast cancer is ER/PR positive and she should have IUD removed. Given she is likely not post-menopausal yet, she is fine to use non-hormonal copper IUD. She can discuss this further with her Gyn.    PLAN:  -Proceed with surgery soon  -Oncotype on her surgical sample  -F/u after radiation or sooner if needed    No orders of the defined types were placed in this encounter.   All questions were answered. The patient knows to call the clinic with any problems, questions or concerns. The total time spent in the appointment was 50 minutes.     Truitt Merle, MD 01/11/2021 6:08 PM  I, Joslyn Devon, am acting as scribe for Truitt Merle, MD.   I have reviewed the above documentation for accuracy and completeness, and I agree with the above.

## 2021-01-12 ENCOUNTER — Encounter: Payer: Self-pay | Admitting: Genetic Counselor

## 2021-01-12 DIAGNOSIS — Z8042 Family history of malignant neoplasm of prostate: Secondary | ICD-10-CM | POA: Insufficient documentation

## 2021-01-12 DIAGNOSIS — Z800 Family history of malignant neoplasm of digestive organs: Secondary | ICD-10-CM | POA: Insufficient documentation

## 2021-01-12 NOTE — Progress Notes (Signed)
REFERRING PROVIDER: Truitt Merle, MD [place],  [place] [id number]  PRIMARY PROVIDER:  [name], FNP  PRIMARY REASON FOR VISIT:  1. Malignant neoplasm of upper-outer quadrant of left breast in female, estrogen receptor positive ([place])   2. Family history of prostate cancer   3. Family history of colon cancer   4. Family history of pancreatic cancer      HISTORY OF PRESENT ILLNESS:   [name], a 50 y.o. female, was seen for a [facility] cancer genetics consultation at the request of Dr. Burr Medico due to a personal and family history of cancer.  Brandi Phelps presents to clinic today to discuss the possibility of a hereditary predisposition to cancer, genetic testing, and to further clarify her future cancer risks, as well as potential cancer risks for family members.   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 The tumor is ER+/PR+/Her2-. The treatment plan includes surgery, oncotype DX, radiation therapy, and antiestrogen therapy.   CANCER HISTORY:  Oncology History Overview Note  Cancer Staging No matching staging information was found for the patient.    Malignant neoplasm of upper-outer quadrant of left breast in female, estrogen receptor positive ([name])  01/05/2021 Initial Biopsy   Diagnosis Breast, left, needle core biopsy, upper outer - INVASIVE DUCTAL CARCINOMA, GRADE 1/2. - DUCTAL CARCINOMA IN SITU. Microscopic Comment The greatest tumor dimension is 0.8 cm. A breast prognostic profile will be performed. Dr. Tresa Moore agrees.   01/05/2021 Receptors her2   PROGNOSTIC INDICATORS Results: IMMUNOHISTOCHEMICAL AND MORPHOMETRIC ANALYSIS PERFORMED MANUALLY The tumor cells are NEGATIVE for Her2 (1+). Estrogen Receptor: 85%, POSITIVE, MODERATE STAINING INTENSITY Progesterone Receptor: 95%, POSITIVE, STRONG STAINING INTENSITY Proliferation Marker Ki67: 10%   01/05/2021  Mammogram   Mammogram  Left breast mass 1:30 position   01/10/2021 Initial Diagnosis   Malignant neoplasm of upper-outer quadrant of left breast in female, estrogen receptor positive ([place])   01/11/2021 Cancer Staging   Staging form: Breast, AJCC 8th Edition - Clinical stage from 01/11/2021: Stage IA (cT1b, cN0, cM0, G2, ER+, PR+, HER2-) - Signed by Truitt Merle, MD on 01/11/2021 Stage prefix: Initial diagnosis Histologic grading system: 3 grade system Laterality: Left Staged by: Pathologist and managing physician Stage used in treatment planning: Yes National guidelines used in treatment planning: Yes Type of national guideline used in treatment planning: NCCN      RISK FACTORS:  Menarche was at age 20.  First live birth at age 84.  OCP use for approximately 15 years, currently has Mirena.  Ovaries intact: yes.  Hysterectomy: no.  HRT use: 0 years. Colonoscopy: no; not examined. Mammogram within the last year: yes.   Past Medical History:  Diagnosis Date  . Family history of colon cancer   . Family history of pancreatic cancer   . Family history of prostate cancer   . Grover's disease     Past Surgical History:  Procedure Laterality Date  . CESAREAN SECTION      Social History   Socioeconomic History  . Marital status: Married    Spouse name: Not on file  . Number of children: Not on file  . Years of education: Not on file  . Highest education level: Not on file  Occupational History  . Not on file  Tobacco Use  . Smoking status: Former Smoker    Packs/day: 0.50    Years: 5.00  Pack years: 2.50    Quit date: 01/11/1994    Years since quitting: 27.0  . Smokeless tobacco: Not on file  Substance and Sexual Activity  . Alcohol use: Not Currently  . Drug use: Not on file  . Sexual activity: Not on file  Other Topics Concern  . Not on file  Social History Narrative  . Not on file   Social Determinants of Health   Financial Resource Strain: Not on file   Food Insecurity: Not on file  Transportation Needs: Not on file  Physical Activity: Not on file  Stress: Not on file  Social Connections: Not on file     FAMILY HISTORY:  We obtained a detailed, 4-generation family history.  Significant diagnoses are listed below: Family History  Problem Relation Age of Onset  . Prostate cancer Father 80  . Myelodysplastic syndrome Father 50  . Prostate cancer Paternal Uncle   . Colon cancer Maternal Uncle        dx 32s  . Cancer Paternal Grandfather        mouth or throat, dx 70s/80s, smoker  . Pancreatic cancer Maternal Uncle        dx 69s   Brandi Phelps has one daughter (age 57) and two sons (ages 58 and 43). She has one brother (age 15) and one sister (age 10). None of these relatives have had cancer.  Brandi Phelps mother is alive at age 82 without cancer, although she has had multiple skin spots removed. There were two maternal aunts and two maternal uncles. One uncle was diagnosed with colon cancer in his 83s. The other uncle was diagnosed with pancreatic cancer in his 18s. There is no known cancer among maternal cousins. [name] maternal grandmother died at age 77 without cancer. Her maternal grandfather died in his 51s without cancer.   Brandi Phelps father died at age 39 and had prostate cancer (diagnosed age 79) and myelodysplastic syndrome (diagnosed age 77). There was one paternal aunt and there were four paternal uncles. One uncle had prostate cancer. There is no known cancer among paternal cousins. Brandi Phelps paternal grandmother died in her 58s without cancer. Her paternal grandfather died in his 37s or 62s with some type of cancer (possibly mouth or throat).   Brandi Phelps is unaware of previous family history of genetic testing for hereditary cancer risks. Patient's maternal ancestors are of [place] descent, and paternal ancestors are of [place] descent. There is no reported Ashkenazi Jewish ancestry. There is no known  consanguinity.  GENETIC COUNSELING ASSESSMENT: Brandi Phelps is a 50 y.o. female with a personal history of breast cancer and a family history of prostate cancer, MDS, colon cancer, and pancreatic cancer, which is somewhat suggestive of a hereditary cancer syndrome and predisposition to cancer. We, therefore, discussed and recommended the following at today's visit.   DISCUSSION: We discussed that approximately 5-10% of breast cancer is hereditary, with most cases associated with the BRCA1 and BRCA2 genes. There are other genes that can be associated with hereditary breast cancer syndromes. These include ATM, CHEK2, PALB2, etc. We discussed that testing is beneficial for several reasons, including knowing about other cancer risks, identifying potential screening and risk-reduction options that may be appropriate, and to understand if other family members could be at risk for cancer and allow them to undergo genetic testing.   We reviewed the characteristics, features and inheritance patterns of hereditary cancer syndromes. We also discussed genetic testing, including the appropriate family members to test,  the process of testing, insurance coverage and turn-around-time for results. We discussed the implications of a negative, positive and/or variant of uncertain significant result. We recommended Ms. Moffat pursue genetic testing for a hereditary cancer panel such as the [facility] CancerNext-Expanded + RNAinsight gene panel.   The CancerNext-Expanded + RNAinsight gene panel offered by [employer] and includes sequencing and rearrangement analysis for the following 77 genes: AIP, ALK, APC, ATM, AXIN2, BAP1, BARD1, BLM, BMPR1A, BRCA1, BRCA2, BRIP1, CDC73, CDH1, CDK4, CDKN1B, CDKN2A, CHEK2, CTNNA1, DICER1, FANCC, FH, FLCN, GALNT12, KIF1B, LZTR1, MAX, MEN1, MET, MLH1, MSH2, MSH3, MSH6, MUTYH, NBN, NF1, NF2, NTHL1, PALB2, PHOX2B, PMS2, POT1, PRKAR1A, PTCH1, PTEN, RAD51C, RAD51D, RB1, RECQL, RET, SDHA, SDHAF2, SDHB,  SDHC, SDHD, SMAD4, SMARCA4, SMARCB1, SMARCE1, STK11, SUFU, TMEM127, TP53, TSC1, TSC2, VHL and XRCC2 (sequencing and deletion/duplication); EGFR, EGLN1, HOXB13, KIT, MITF, PDGFRA, POLD1 and POLE (sequencing only); EPCAM and GREM1 (deletion/duplication only). RNA data is routinely analyzed for use in variant interpretation for all genes.  Based on Ms. Alamo's personal and family history of cancer, she meets medical criteria for genetic testing. Despite that she meets criteria, there may still be an out of pocket cost.   PLAN:  Ms. Bayon did not wish to pursue genetic testing at today's visit. We understand this decision and remain available to coordinate genetic testing at any time in the future. We, therefore, recommend Ms. Mizuno continue to follow the cancer screening guidelines given by her primary healthcare provider.  Ms. Coiner questions were answered to her satisfaction today. Our contact information was provided should additional questions or concerns arise. Thank you for the referral and allowing [place] to share in the care of your patient.   Clint Guy, [place], [facility] Licensed, Certified Dispensing [email or web address] Phone: [phone]  The patient was seen for a total of 20 minutes in face-to-face genetic counseling.  This patient was discussed with Drs. Magrinat, Lindi Adie and/or Burr Medico who agrees with the above.    _______________________________________________________________________ For Office Staff:  Number of people involved in session: 1 Was an Intern/ student involved with case: no

## 2021-01-12 NOTE — Progress Notes (Signed)
[place] Psychosocial Distress Screening Spiritual Care  Met with Kathaleen Grinder") in Breast Multidisciplinary Clinic to introduce [place] team/resources, reviewing distress screen per protocol.  The patient scored a 4 on the Psychosocial Distress Thermometer which indicates moderate distress. Also assessed for distress and other psychosocial needs.   ONCBCN DISTRESS SCREENING 01/12/2021  Screening Type Initial Screening  Distress experienced in past week (1-10) 4  Practical problem type Work/school  Emotional problem type Nervousness/Anxiety;Adjusting to illness  Spiritual/Religous concerns type Facing my mortality  Information Concerns Type Lack of info about diagnosis;Lack of info about treatment  Physical Problem type Loss of appetitie  Referral to support programs Yes    Ms Bundick reports good support and that busyness helps keep her mind occupied when she might otherwise feel distress. Encouraged [place] (Patient and [facility]) programming as an additional layer of support from outside her regular circles; she notes that her summer schedule may make participation more feasible.  Follow up needed: No. Per Ms Boline, no other needs or concerns at this time, but she is aware of ongoing Spiritual Care and [place] team availability, should needs arise or circumstances change.   [place], [place], [facility] Pager [phone] Voicemail [phone]

## 2021-01-13 ENCOUNTER — Other Ambulatory Visit: Payer: Self-pay | Admitting: Surgery

## 2021-01-13 DIAGNOSIS — Z853 Personal history of malignant neoplasm of breast: Secondary | ICD-10-CM

## 2021-01-17 ENCOUNTER — Telehealth: Payer: Self-pay | Admitting: *Deleted

## 2021-01-17 ENCOUNTER — Encounter: Payer: Self-pay | Admitting: *Deleted

## 2021-01-17 NOTE — Telephone Encounter (Signed)
Spoke with patient to follow up from [facility] 5/11 and assess navigation. Patient denies any questions or concerns at this time.  Encouraged patient to call should anything arise. Patient verbalized understanding.

## 2021-02-03 ENCOUNTER — Encounter (HOSPITAL_BASED_OUTPATIENT_CLINIC_OR_DEPARTMENT_OTHER): Payer: Self-pay | Admitting: Surgery

## 2021-02-03 ENCOUNTER — Other Ambulatory Visit: Payer: Self-pay

## 2021-02-07 ENCOUNTER — Other Ambulatory Visit: Payer: Self-pay

## 2021-02-07 ENCOUNTER — Other Ambulatory Visit
Admission: RE | Admit: 2021-02-07 | Discharge: 2021-02-07 | Disposition: A | Discharge status: Home / Self Care | Payer: BC Managed Care – PPO | Source: Ambulatory Visit | Attending: Surgery | Admitting: Surgery

## 2021-02-07 DIAGNOSIS — Z20822 Contact with and (suspected) exposure to covid-19: Secondary | ICD-10-CM | POA: Insufficient documentation

## 2021-02-07 DIAGNOSIS — Z01812 Encounter for preprocedural laboratory examination: Secondary | ICD-10-CM | POA: Diagnosis not present

## 2021-02-07 LAB — SARS CORONAVIRUS 2 (TAT 6-24 HRS): SARS Coronavirus 2: NEGATIVE

## 2021-02-09 ENCOUNTER — Ambulatory Visit
Admission: RE | Admit: 2021-02-09 | Discharge: 2021-02-09 | Disposition: A | Discharge status: Home / Self Care | Payer: BC Managed Care – PPO | Source: Ambulatory Visit | Attending: Surgery | Admitting: Surgery

## 2021-02-09 ENCOUNTER — Other Ambulatory Visit: Payer: Self-pay

## 2021-02-09 DIAGNOSIS — Z853 Personal history of malignant neoplasm of breast: Secondary | ICD-10-CM

## 2021-02-09 MED ORDER — ENSURE PRE-SURGERY PO LIQD: 296.0000 mL | Freq: Once | ORAL | Status: DC

## 2021-02-09 NOTE — Progress Notes (Signed)

## 2021-02-09 NOTE — H&P (Signed)
Brandi Phelps Appointment: 01/11/2021 1:00 PM Location: [facility] Patient #: [id number] DOB: 28-May-1971 Undefined / Language: Brandi Phelps / Race: White Female   History of Present Illness Brandi Canary A. Ninfa Linden MD; 01/11/2021 3:41 PM) The patient is a 50 year old female who presents with breast cancer.  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  Ultrasound of the axilla was unremarkable.  The mass was biopsied showing invasive and in situ ductal carcinoma.  It was 85% estrogen positive, 95% PR positive, HER2 negative, Ki-67 was 10%.  She is otherwise healthy without complaints.  Family history is negative for breast cancer.  She denies nipple discharge.  She has no cardiopulmonary issues.   Past Surgical History Brandi Slipper, RN; 01/11/2021 8:17 AM) Cesarean Section - Multiple   Oral Surgery    Diagnostic Studies History Brandi Slipper, RN; 01/11/2021 8:17 AM) Colonoscopy   never Mammogram   within last year Pap Smear   1-5 years ago  Medication History Brandi Slipper, RN; 01/11/2021 8:17 AM) Medications Reconciled   Social History Brandi Slipper, RN; 01/11/2021 8:17 AM) Alcohol use   Remotely quit alcohol use. No caffeine use   No drug use   Tobacco use   Former smoker.  Family History Brandi Slipper, RN; 01/11/2021 8:17 AM) Arthritis   Father, Mother. Bleeding disorder   Father. Cerebrovascular Accident   Father, Mother. Diabetes Mellitus   Family Members In General, Father, Sister. Heart disease in female family member before age 49   Hypertension   Mother. Prostate Cancer   Father.  Pregnancy / Birth History Brandi Slipper, RN; 01/11/2021 8:17 AM) Age at menarche   45 years. Contraceptive History   Oral contraceptives. Gravida   3 Length (months) of breastfeeding   3-6 Maternal age   11-30 Para   3 Regular periods     Other Problems Brandi Slipper, RN; 01/11/2021 8:17 AM) Bladder Problems   Lump In Breast      Review of Systems Brandi Slipper RN; 01/11/2021 8:17 AM) General Not Present- Appetite Loss, Chills, Fatigue, Fever, Night Sweats, Weight Gain and Weight Loss. Skin Not Present- Change in Wart/Mole, Dryness, Hives, Jaundice, New Lesions, Non-Healing Wounds, Rash and Ulcer. HEENT Present- Seasonal Allergies and Wears glasses/contact lenses. Not Present- Earache, Hearing Loss, Hoarseness, Nose Bleed, Oral Ulcers, Ringing in the Ears, Sinus Pain, Sore Throat, Visual Disturbances and Yellow Eyes. Respiratory Present- Snoring. Not Present- Bloody sputum, Chronic Cough, Difficulty Breathing and Wheezing. Breast Present- Breast Mass and Breast Pain. Not Present- Nipple Discharge and Skin Changes. Cardiovascular Not Present- Chest Pain, Difficulty Breathing Lying Down, Leg Cramps, Palpitations, Rapid Heart Rate, Shortness of Breath and Swelling of Extremities. Female Genitourinary Present- Frequency and Painful Urination. Not Present- Nocturia, Pelvic Pain and Urgency. Neurological Not Present- Decreased Memory, Fainting, Headaches, Numbness, Seizures, Tingling, Tremor, Trouble walking and Weakness. Psychiatric Not Present- Anxiety, Bipolar, Change in Sleep Pattern, Depression, Fearful and Frequent crying. Endocrine Not Present- Cold Intolerance, Excessive Hunger, Hair Changes, Heat Intolerance, Hot flashes and New Diabetes. Hematology Not Present- Blood Thinners, Easy Bruising, Excessive bleeding, Gland problems, HIV and Persistent Infections.   Physical Exam (Brandi Phelps A. Ninfa Linden MD; 01/11/2021 3:41 PM) The physical exam findings are as follows: Note:  She appears well on exam  No palpable breast masses. There is no axillary adenopathy. There is minimal ecchymosis from the left breast biopsy.  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 I reviewed her mammograms, ultrasound, and pathology results. We also discussed directly this morning multidisciplinary breast cancer conference. She has left breast invasive ductal carcinoma ductal carcinoma in situ. Biopsy site incisions are generalized in detail. From a surgical standpoint we discussed breast cancer. These include breast conservation with an left axillary followed by radiation versus mastectomy. She also discussed evaluation of her lymph nodes. She wishes to proceed with breast conservation. We next discussed proceeding with a radioactive seed guided lumpectomy and left axillary sentinel lymph node biopsy. We discussed the risk of procedure which includes but is not limited to bleeding, infection, injury to surrounding structures, and need for further surgery if margins or lymph nodes are positive. We discussed postoperative recovery. We discussed other issues with anesthesia. She understands and wished to proceed with surgery which will be scheduled as soon as possible.

## 2021-02-10 ENCOUNTER — Encounter (HOSPITAL_BASED_OUTPATIENT_CLINIC_OR_DEPARTMENT_OTHER): Payer: Self-pay | Admitting: Surgery

## 2021-02-10 ENCOUNTER — Ambulatory Visit (HOSPITAL_BASED_OUTPATIENT_CLINIC_OR_DEPARTMENT_OTHER)
Admission: RE | Admit: 2021-02-10 | Discharge: 2021-02-10 | Disposition: A | Discharge status: Home / Self Care | Payer: BC Managed Care – PPO | Attending: Surgery | Admitting: Surgery

## 2021-02-10 ENCOUNTER — Encounter (HOSPITAL_BASED_OUTPATIENT_CLINIC_OR_DEPARTMENT_OTHER)
Admission: RE | Disposition: A | Discharge status: Home / Self Care | Payer: Self-pay | Source: Home / Self Care | Attending: Surgery

## 2021-02-10 ENCOUNTER — Ambulatory Visit (HOSPITAL_COMMUNITY)
Admission: RE | Admit: 2021-02-10 | Discharge: 2021-02-10 | Disposition: A | Discharge status: Home / Self Care | Payer: BC Managed Care – PPO | Source: Ambulatory Visit | Attending: Surgery | Admitting: Surgery

## 2021-02-10 ENCOUNTER — Ambulatory Visit (HOSPITAL_BASED_OUTPATIENT_CLINIC_OR_DEPARTMENT_OTHER): Payer: BC Managed Care – PPO | Admitting: Anesthesiology

## 2021-02-10 ENCOUNTER — Ambulatory Visit
Admission: RE | Admit: 2021-02-10 | Discharge: 2021-02-10 | Disposition: A | Discharge status: Home / Self Care | Payer: BC Managed Care – PPO | Source: Ambulatory Visit | Attending: Surgery | Admitting: Surgery

## 2021-02-10 DIAGNOSIS — C50412 Malignant neoplasm of upper-outer quadrant of left female breast: Secondary | ICD-10-CM | POA: Insufficient documentation

## 2021-02-10 DIAGNOSIS — Z853 Personal history of malignant neoplasm of breast: Secondary | ICD-10-CM

## 2021-02-10 DIAGNOSIS — Z87891 Personal history of nicotine dependence: Secondary | ICD-10-CM | POA: Diagnosis not present

## 2021-02-10 DIAGNOSIS — C50912 Malignant neoplasm of unspecified site of left female breast: Secondary | ICD-10-CM | POA: Diagnosis present

## 2021-02-10 HISTORY — DX: Family history of other specified conditions: Z84.89

## 2021-02-10 HISTORY — DX: Other complications of anesthesia, initial encounter: T88.59XA

## 2021-02-10 HISTORY — PX: BREAST LUMPECTOMY WITH RADIOACTIVE SEED AND SENTINEL LYMPH NODE BIOPSY: SHX6550

## 2021-02-10 LAB — POCT PREGNANCY, URINE: Preg Test, Ur: NEGATIVE

## 2021-02-10 SURGERY — BREAST LUMPECTOMY WITH RADIOACTIVE SEED AND SENTINEL LYMPH NODE BIOPSY
Anesthesia: General | Site: Breast | Laterality: Left

## 2021-02-10 MED ORDER — METHYLENE BLUE 0.5 % INJ SOLN
INTRAVENOUS | Status: AC
  Filled 2021-02-10: qty 10

## 2021-02-10 MED ORDER — MIDAZOLAM HCL 2 MG/2ML IJ SOLN
INTRAMUSCULAR | Status: AC
  Filled 2021-02-10: qty 2

## 2021-02-10 MED ORDER — BUPIVACAINE-EPINEPHRINE (PF) 0.5% -1:200000 IJ SOLN
INTRAMUSCULAR | Status: AC
  Filled 2021-02-10: qty 30

## 2021-02-10 MED ORDER — BUPIVACAINE-EPINEPHRINE 0.5% -1:200000 IJ SOLN
INTRAMUSCULAR | Status: DC | PRN
  Administered 2021-02-10: 10 mL

## 2021-02-10 MED ORDER — FENTANYL CITRATE (PF) 100 MCG/2ML IJ SOLN
INTRAMUSCULAR | Status: DC | PRN
  Administered 2021-02-10 (×2): 50 ug via INTRAVENOUS

## 2021-02-10 MED ORDER — CEFAZOLIN SODIUM-DEXTROSE 2-3 GM-%(50ML) IV SOLR
INTRAVENOUS | Status: DC | PRN
  Administered 2021-02-10: 2 g via INTRAVENOUS

## 2021-02-10 MED ORDER — CHLORHEXIDINE GLUCONATE CLOTH 2 % EX PADS: 6.0000 | MEDICATED_PAD | Freq: Once | CUTANEOUS | Status: DC

## 2021-02-10 MED ORDER — PROPOFOL 10 MG/ML IV BOLUS
INTRAVENOUS | Status: AC
  Filled 2021-02-10: qty 20

## 2021-02-10 MED ORDER — OXYCODONE HCL 5 MG PO TABS
ORAL_TABLET | ORAL | Status: AC
  Filled 2021-02-10: qty 1

## 2021-02-10 MED ORDER — LACTATED RINGERS IV SOLN: INTRAVENOUS | Status: DC | PRN

## 2021-02-10 MED ORDER — TECHNETIUM TC 99M TILMANOCEPT KIT
1.0000 | PACK | Freq: Once | INTRAVENOUS | Status: AC | PRN
  Administered 2021-02-10: 1 via INTRADERMAL

## 2021-02-10 MED ORDER — ACETAMINOPHEN 500 MG PO TABS
1000.0000 mg | ORAL_TABLET | ORAL | Status: AC
  Administered 2021-02-10: 1000 mg via ORAL

## 2021-02-10 MED ORDER — CEFAZOLIN SODIUM-DEXTROSE 2-4 GM/100ML-% IV SOLN
INTRAVENOUS | Status: AC
  Filled 2021-02-10: qty 100

## 2021-02-10 MED ORDER — ACETAMINOPHEN 500 MG PO TABS
ORAL_TABLET | ORAL | Status: AC
  Filled 2021-02-10: qty 2

## 2021-02-10 MED ORDER — PROPOFOL 10 MG/ML IV BOLUS
INTRAVENOUS | Status: DC | PRN
  Administered 2021-02-10: 150 mg via INTRAVENOUS

## 2021-02-10 MED ORDER — OXYCODONE HCL 5 MG PO TABS: 5.0000 mg | ORAL_TABLET | Freq: Four times a day (QID) | ORAL | 0 refills | Status: DC | PRN

## 2021-02-10 MED ORDER — DEXAMETHASONE SODIUM PHOSPHATE 10 MG/ML IJ SOLN
INTRAMUSCULAR | Status: DC | PRN
  Administered 2021-02-10: 5 mg via INTRAVENOUS

## 2021-02-10 MED ORDER — MIDAZOLAM HCL 2 MG/2ML IJ SOLN
INTRAMUSCULAR | Status: DC | PRN
  Administered 2021-02-10: 2 mg via INTRAVENOUS

## 2021-02-10 MED ORDER — SODIUM CHLORIDE (PF) 0.9 % IJ SOLN
INTRAMUSCULAR | Status: AC
  Filled 2021-02-10: qty 10

## 2021-02-10 MED ORDER — FENTANYL CITRATE (PF) 100 MCG/2ML IJ SOLN: 25.0000 ug | INTRAMUSCULAR | Status: DC | PRN

## 2021-02-10 MED ORDER — LIDOCAINE HCL (CARDIAC) PF 100 MG/5ML IV SOSY
PREFILLED_SYRINGE | INTRAVENOUS | Status: DC | PRN
  Administered 2021-02-10: 30 mg via INTRAVENOUS

## 2021-02-10 MED ORDER — FENTANYL CITRATE (PF) 100 MCG/2ML IJ SOLN
INTRAMUSCULAR | Status: AC
  Filled 2021-02-10: qty 2

## 2021-02-10 MED ORDER — ONDANSETRON HCL 4 MG/2ML IJ SOLN: 4.0000 mg | Freq: Once | INTRAMUSCULAR | Status: DC | PRN

## 2021-02-10 MED ORDER — FENTANYL CITRATE (PF) 100 MCG/2ML IJ SOLN
100.0000 ug | Freq: Once | INTRAMUSCULAR | Status: AC
  Administered 2021-02-10: 50 ug via INTRAVENOUS

## 2021-02-10 MED ORDER — MIDAZOLAM HCL 2 MG/2ML IJ SOLN
2.0000 mg | Freq: Once | INTRAMUSCULAR | Status: AC
  Administered 2021-02-10: 1 mg via INTRAVENOUS

## 2021-02-10 MED ORDER — LACTATED RINGERS IV SOLN: INTRAVENOUS | Status: DC

## 2021-02-10 MED ORDER — ONDANSETRON HCL 4 MG/2ML IJ SOLN
INTRAMUSCULAR | Status: DC | PRN
  Administered 2021-02-10: 4 mg via INTRAVENOUS

## 2021-02-10 MED ORDER — OXYCODONE HCL 5 MG/5ML PO SOLN: 5.0000 mg | Freq: Once | ORAL | Status: AC | PRN

## 2021-02-10 MED ORDER — OXYCODONE HCL 5 MG PO TABS
5.0000 mg | ORAL_TABLET | Freq: Once | ORAL | Status: AC | PRN
  Administered 2021-02-10: 5 mg via ORAL

## 2021-02-10 MED ORDER — CEFAZOLIN SODIUM-DEXTROSE 2-4 GM/100ML-% IV SOLN: 2.0000 g | INTRAVENOUS | Status: DC

## 2021-02-10 MED ORDER — 0.9 % SODIUM CHLORIDE (POUR BTL) OPTIME
TOPICAL | Status: DC | PRN
  Administered 2021-02-10: 300 mL

## 2021-02-10 SURGICAL SUPPLY — 49 items
ADH SKN CLS APL DERMABOND .7 (GAUZE/BANDAGES/DRESSINGS) ×1
APL PRP STRL LF DISP 70% ISPRP (MISCELLANEOUS) ×1
APPLIER CLIP 9.375 MED OPEN (MISCELLANEOUS) ×3
APR CLP MED 9.3 20 MLT OPN (MISCELLANEOUS) ×1
BINDER BREAST XLRG (GAUZE/BANDAGES/DRESSINGS) ×3 IMPLANT
BLADE SURG 15 STRL LF DISP TIS (BLADE) ×1 IMPLANT
BLADE SURG 15 STRL SS (BLADE) ×3
CANISTER SUCT 1200ML W/VALVE (MISCELLANEOUS) IMPLANT
CHLORAPREP W/TINT 26 (MISCELLANEOUS) ×3 IMPLANT
CLIP APPLIE 9.375 MED OPEN (MISCELLANEOUS) ×1 IMPLANT
CLIP VESOCCLUDE SM WIDE 6/CT (CLIP) IMPLANT
COVER BACK TABLE 60X90IN (DRAPES) ×3 IMPLANT
COVER MAYO STAND STRL (DRAPES) ×3 IMPLANT
COVER PROBE W GEL 5X96 (DRAPES) ×3 IMPLANT
COVER WAND RF STERILE (DRAPES) IMPLANT
DECANTER SPIKE VIAL GLASS SM (MISCELLANEOUS) IMPLANT
DERMABOND ADVANCED (GAUZE/BANDAGES/DRESSINGS) ×2
DERMABOND ADVANCED .7 DNX12 (GAUZE/BANDAGES/DRESSINGS) ×1 IMPLANT
DRAPE LAPAROSCOPIC ABDOMINAL (DRAPES) ×3 IMPLANT
DRAPE UTILITY XL STRL (DRAPES) ×3 IMPLANT
ELECT REM PT RETURN 9FT ADLT (ELECTROSURGICAL) ×3
ELECTRODE REM PT RTRN 9FT ADLT (ELECTROSURGICAL) ×1 IMPLANT
GAUZE SPONGE 4X4 12PLY STRL LF (GAUZE/BANDAGES/DRESSINGS) IMPLANT
GLOVE SURG POLYISO LF SZ6.5 (GLOVE) ×6 IMPLANT
GLOVE SURG SIGNA 7.5 PF LTX (GLOVE) ×3 IMPLANT
GLOVE SURG UNDER POLY LF SZ7 (GLOVE) ×6 IMPLANT
GOWN STRL REUS W/ TWL LRG LVL3 (GOWN DISPOSABLE) ×2 IMPLANT
GOWN STRL REUS W/ TWL XL LVL3 (GOWN DISPOSABLE) ×1 IMPLANT
GOWN STRL REUS W/TWL LRG LVL3 (GOWN DISPOSABLE) ×6
GOWN STRL REUS W/TWL XL LVL3 (GOWN DISPOSABLE) ×3
KIT MARKER MARGIN INK (KITS) ×3 IMPLANT
NDL SAFETY ECLIPSE 18X1.5 (NEEDLE) IMPLANT
NEEDLE HYPO 18GX1.5 SHARP (NEEDLE)
NEEDLE HYPO 25X1 1.5 SAFETY (NEEDLE) ×3 IMPLANT
NS IRRIG 1000ML POUR BTL (IV SOLUTION) ×3 IMPLANT
PACK BASIN DAY SURGERY FS (CUSTOM PROCEDURE TRAY) ×3 IMPLANT
PENCIL SMOKE EVACUATOR (MISCELLANEOUS) ×3 IMPLANT
SLEEVE SCD COMPRESS KNEE MED (STOCKING) ×3 IMPLANT
SPONGE LAP 4X18 RFD (DISPOSABLE) ×3 IMPLANT
SUT MNCRL AB 4-0 PS2 18 (SUTURE) ×3 IMPLANT
SUT SILK 2 0 SH (SUTURE) IMPLANT
SUT VIC AB 3-0 SH 27 (SUTURE) ×3
SUT VIC AB 3-0 SH 27X BRD (SUTURE) ×1 IMPLANT
SYR CONTROL 10ML LL (SYRINGE) ×3 IMPLANT
TOWEL GREEN STERILE FF (TOWEL DISPOSABLE) ×3 IMPLANT
TRAY FAXITRON CT DISP (TRAY / TRAY PROCEDURE) ×3 IMPLANT
TUBE CONNECTING 20'X1/4 (TUBING) ×1
TUBE CONNECTING 20X1/4 (TUBING) ×2 IMPLANT
YANKAUER SUCT BULB TIP NO VENT (SUCTIONS) ×3 IMPLANT

## 2021-02-10 NOTE — Progress Notes (Signed)
Nuc med injections completed. Patient tolerated well.   

## 2021-02-10 NOTE — Interval H&P Note (Signed)
History and Physical Interval Note:no change in H and P  02/10/2021 7:08 AM  Brandi Phelps  has presented today for surgery, with the diagnosis of LEFT BREAST CANCER.  The various methods of treatment have been discussed with the patient and family. After consideration of risks, benefits and other options for treatment, the patient has consented to  Procedure(s): LEFT BREAST LUMPECTOMY WITH RADIOACTIVE SEED AND SENTINEL LYMPH NODE BIOPSY (Left) as a surgical intervention.  The patient's history has been reviewed, patient examined, no change in status, stable for surgery.  I have reviewed the patient's chart and labs.  Questions were answered to the patient's satisfaction.     Coralie Keens

## 2021-02-10 NOTE — Anesthesia Preprocedure Evaluation (Addendum)
Anesthesia Evaluation  Patient identified by MRN, date of birth, ID band Patient awake    Reviewed: Allergy & Precautions, NPO status , Patient's Chart, lab work & pertinent test results  Airway Mallampati: II  TM Distance: >3 FB Neck ROM: Full    Dental  (+) Teeth Intact, Dental Advisory Given   Pulmonary former smoker,    breath sounds clear to auscultation       Cardiovascular  Rhythm:Regular Rate:Normal     Neuro/Psych    GI/Hepatic   Endo/Other    Renal/GU      Musculoskeletal   Abdominal   Peds  Hematology   Anesthesia Other Findings   Reproductive/Obstetrics                            Anesthesia Physical Anesthesia Plan  ASA: 2  Anesthesia Plan: General   Post-op Pain Management:  Regional for Post-op pain   Induction: Intravenous  PONV Risk Score and Plan: Ondansetron and Dexamethasone  Airway Management Planned: LMA  Additional Equipment:   Intra-op Plan:   Post-operative Plan:   Informed Consent: I have reviewed the patients History and Physical, chart, labs and discussed the procedure including the risks, benefits and alternatives for the proposed anesthesia with the patient or authorized representative who has indicated his/her understanding and acceptance.     Dental advisory given  Plan Discussed with: CRNA and Anesthesiologist  Anesthesia Plan Comments:         Anesthesia Quick Evaluation

## 2021-02-10 NOTE — Anesthesia Procedure Notes (Addendum)
Procedure Name: LMA Insertion Date/Time: 02/10/2021 7:47 AM Performed by: Verita Lamb, CRNA Pre-anesthesia Checklist: Patient identified, Emergency Drugs available, Suction available and Patient being monitored Patient Re-evaluated:Patient Re-evaluated prior to induction Oxygen Delivery Method: Circle system utilized Preoxygenation: Pre-oxygenation with 100% oxygen Induction Type: IV induction Ventilation: Mask ventilation without difficulty LMA: LMA inserted LMA Size: 4.0 Number of attempts: 1 Airway Equipment and Method: Bite block Placement Confirmation: positive ETCO2, CO2 detector and breath sounds checked- equal and bilateral Tube secured with: Tape Dental Injury: Teeth and Oropharynx as per pre-operative assessment

## 2021-02-10 NOTE — Transfer of Care (Signed)
Immediate Anesthesia Transfer of Care Note  Patient: Brandi Phelps  Procedure(s) Performed: LEFT BREAST LUMPECTOMY WITH RADIOACTIVE SEED AND SENTINEL LYMPH NODE BIOPSY (Left: Breast)  Patient Location: PACU  Anesthesia Type:General  Level of Consciousness: awake, alert  and oriented  Airway & Oxygen Therapy: Patient Spontanous Breathing and Patient connected to face mask oxygen  Post-op Assessment: Report given to RN and Post -op Vital signs reviewed and stable  Post vital signs: Reviewed and stable  Last Vitals:  Vitals Value Taken Time  BP    Temp    Pulse    Resp    SpO2      Last Pain:  Vitals:   02/10/21 0625  TempSrc: Oral  PainSc: 0-No pain      Patients Stated Pain Goal: 3 (20/72/18 2883)  Complications: No notable events documented.

## 2021-02-10 NOTE — Anesthesia Procedure Notes (Signed)
Anesthesia Regional Block: Pectoralis block   Pre-Anesthetic Checklist: , timeout performed,  Correct Patient, Correct Site, Correct Laterality,  Correct Procedure, Correct Position, site marked,  Risks and benefits discussed,  Surgical consent,  Pre-op evaluation,  At surgeon's request and post-op pain management  Laterality: Left  Prep: chloraprep       Needles:  Injection technique: Single-shot  Needle Type: Echogenic Needle     Needle Length: 9cm  Needle Gauge: 21     Additional Needles:   Procedures:,,,, ultrasound used (permanent image in chart),,    Narrative:  Start time: 02/10/2021 7:05 AM End time: 02/10/2021 7:15 AM Injection made incrementally with aspirations every 5 mL.  Performed by: Personally  Anesthesiologist: Roberts Gaudy, MD  Additional Notes: 25 cc 0.25% Bupivacaine 1:200 Epi 10 cc 1.3% Exparel

## 2021-02-10 NOTE — Discharge Instructions (Addendum)
Next dose of Oxycodone at 3:30 PM  [employer] Phone Number [phone]  BREAST BIOPSY/ PARTIAL MASTECTOMY: POST OP INSTRUCTIONS  Always review your discharge instruction sheet given to you by the facility where your surgery was performed.  IF YOU HAVE DISABILITY OR FAMILY LEAVE FORMS, YOU MUST BRING THEM TO THE OFFICE FOR PROCESSING.  DO NOT GIVE THEM TO YOUR DOCTOR.  A prescription for pain medication may be given to you upon discharge.  Take your pain medication as prescribed, if needed.  If narcotic pain medicine is not needed, then you may take acetaminophen (Tylenol) or ibuprofen (Advil) as needed. Take your usually prescribed medications unless otherwise directed If you need a refill on your pain medication, please contact your pharmacy.  They will contact our office to request authorization.  Prescriptions will not be filled after 5pm or on week-ends. You should eat very light the first 24 hours after surgery, such as soup, crackers, pudding, etc.  Resume your normal diet the day after surgery. Most patients will experience some swelling and bruising in the breast.  Ice packs and a good support bra will help.  Swelling and bruising can take several days to resolve.  It is common to experience some constipation if taking pain medication after surgery.  Increasing fluid intake and taking a stool softener will usually help or prevent this problem from occurring.  A mild laxative (Milk of Magnesia or Miralax) should be taken according to package directions if there are no bowel movements after 48 hours. Unless discharge instructions indicate otherwise, you may remove your bandages 24-48 hours after surgery, and you may shower at that time.  You may have steri-strips (small skin tapes) in place directly over the incision.  These strips should be left on the skin for 7-10 days.  If your surgeon used skin glue on the incision, you may shower in 24 hours.  The glue will flake  off over the next 2-3 weeks.  Any sutures or staples will be removed at the office during your follow-up visit. ACTIVITIES:  You may resume regular daily activities (gradually increasing) beginning the next day.  Wearing a good support bra or sports bra minimizes pain and swelling.  You may have sexual intercourse when it is comfortable. You may drive when you no longer are taking prescription pain medication, you can comfortably wear a seatbelt, and you can safely maneuver your car and apply brakes. RETURN TO WORK:  ______________________________________________________________________________________ Dennis Bast should see your doctor in the office for a follow-up appointment approximately two weeks after your surgery.  Your doctor's nurse will typically make your follow-up appointment when she calls you with your pathology report.  Expect your pathology report 2-3 business days after your surgery.  You may call to check if you do not hear from [place] after three days. OTHER INSTRUCTIONS: OK TO REMOVE THE BINDER AND SHOWER STARTING TOMORROW ICE PACK AND TYLENOL ALSO FOR PAIN NO VIGOROUS ACTIVITY FOR ONE WEEK _______________________________________________________________________________________________ _____________________________________________________________________________________________________________________________________ _____________________________________________________________________________________________________________________________________ _____________________________________________________________________________________________________________________________________  WHEN TO CALL YOUR DOCTOR: Fever over 101.0 Nausea and/or vomiting. Extreme swelling or bruising. Continued bleeding from incision. Increased pain, redness, or drainage from the incision.  The clinic staff is available to answer your questions during regular business hours.  Please don't hesitate to call and ask to  speak to one of the nurses for clinical concerns.  If you have a medical emergency, go to the nearest emergency room or call 911.  A surgeon from [facility] is always  on call at the hospital.  For further questions, please visit [email or web address]    May have Tylenol after 1230 today, if needed.   Post Anesthesia Home Care Instructions  Activity: Get plenty of rest for the remainder of the day. A responsible individual must stay with you for 24 hours following the procedure.  For the next 24 hours, DO NOT: -Drive a car -Paediatric nurse -Drink alcoholic beverages -Take any medication unless instructed by your physician -Make any legal decisions or sign important papers.  Meals: Start with liquid foods such as gelatin or soup. Progress to regular foods as tolerated. Avoid greasy, spicy, heavy foods. If nausea and/or vomiting occur, drink only clear liquids until the nausea and/or vomiting subsides. Call your physician if vomiting continues.  Special Instructions/Symptoms: Your throat may feel dry or sore from the anesthesia or the breathing tube placed in your throat during surgery. If this causes discomfort, gargle with warm salt water. The discomfort should disappear within 24 hours.  If you had a scopolamine patch placed behind your ear for the management of post- operative nausea and/or vomiting:  1. The medication in the patch is effective for 72 hours, after which it should be removed.  Wrap patch in a tissue and discard in the trash. Wash hands thoroughly with soap and water. 2. You may remove the patch earlier than 72 hours if you experience unpleasant side effects which may include dry mouth, dizziness or visual disturbances. 3. Avoid touching the patch. Wash your hands with soap and water after contact with the patch.

## 2021-02-10 NOTE — Progress Notes (Signed)
Assisted Dr. Joslin with left, ultrasound guided, pectoralis block. Side rails up, monitors on throughout procedure. See vital signs in flow sheet. Tolerated Procedure well. 

## 2021-02-10 NOTE — Op Note (Signed)
   JENNETTA FLOOD 02/10/2021   Pre-op Diagnosis: LEFT BREAST CANCER     Post-op Diagnosis: same  Procedure(s): LEFT BREAST LUMPECTOMY WITH RADIOACTIVE SEED AND DEEP LEFT AXILLARY SENTINEL LYMPH NODE BIOPSY  Surgeon(s): Coralie Keens, MD  Anesthesia: General  Staff:  Circulator: Eston Esters, RN Scrub Person: Izora Ribas, RN  Estimated Blood Loss: Minimal               Specimens: sent to path  Indications: This is a 50 year old female recently diagnosed with a mass in the left breast.  She underwent a biopsy showing invasive and in situ ductal carcinoma.  The decision was made to proceed with a left breast radioactive seed guided lumpectomy and sentinel lymph node biopsy  Procedure: The patient brought to operating identifies correct patient.  She is placed upon the operating table general anesthesia was induced.  She had already been injected around the areola by the radiation technologist and had a pec block by anesthesiology.  Her left breast was prepped and draped in usual sterile fashion.  Using neoprobe I located the radioactive seed at the 1:30 position deep in the breast toward the axilla.  At this point I elected to make an incision toward the axilla to remove both breast cancer and the lymph nodes.  I anesthetized skin with Marcaine and then made incision with a scalpel.  I then dissected into the breast tissue electrocautery.  With the aid of the neoprobe I then dissected medially and then down toward the palpable mass.  I took all the tissue anterior and posterior the mass going all the way down to the chest wall.  I tried to stay widely around the mass with the cautery.  Once the mass was removed it was x-rayed confirming the radioactive seed and previous tissue marker in the specimen.  I had marked the margins with paint.  I elected to take further medial and inferior margin. Next I directed dissection toward the axilla.  I dissected into the deep left  axillary tissue and with the neoprobe identified 2 sentinel lymph nodes.  These were excised and sent to pathology for evaluation.  I achieved hemostasis with surgical clips and the cautery. Surgical clips around the periphery of the lumpectomy cavity.  I injected it further with Marcaine.  Hemostasis appeared to be achieved.  I then closed the subcutaneous tissue with interrupted 3-0 Vicryl sutures and closed skin with a running 4-0 Monocryl.  Dermabond and a breast binder were applied.  The patient tolerated the procedure well.  All the counts were correct at the end of the procedure.  The patient was then extubated in the operating room and taken in stable condition to the recovery room.          Coralie Keens   Date: 02/10/2021  Time: 8:27 AM

## 2021-02-13 ENCOUNTER — Encounter (HOSPITAL_BASED_OUTPATIENT_CLINIC_OR_DEPARTMENT_OTHER): Payer: Self-pay | Admitting: Surgery

## 2021-02-13 LAB — SURGICAL PATHOLOGY

## 2021-02-13 NOTE — Anesthesia Postprocedure Evaluation (Signed)
Anesthesia Post Note  Patient: JEZEBELLE LEDWELL  Procedure(s) Performed: LEFT BREAST LUMPECTOMY WITH RADIOACTIVE SEED AND SENTINEL LYMPH NODE BIOPSY (Left: Breast)     Patient location during evaluation: PACU Anesthesia Type: General Level of consciousness: awake and alert Pain management: pain level controlled Vital Signs Assessment: post-procedure vital signs reviewed and stable Respiratory status: spontaneous breathing, nonlabored ventilation, respiratory function stable and patient connected to nasal cannula oxygen Cardiovascular status: blood pressure returned to baseline and stable Postop Assessment: no apparent nausea or vomiting Anesthetic complications: no   No notable events documented.  Last Vitals:  Vitals:   02/10/21 0905 02/10/21 0930  BP:  113/70  Pulse: 81 77  Resp: 16 16  Temp:  37.1 C  SpO2: 98% 100%    Last Pain:  Vitals:   02/13/21 0901  TempSrc:   PainSc: 0-No pain                 Haillee Johann COKER

## 2021-02-15 ENCOUNTER — Encounter: Payer: Self-pay | Admitting: *Deleted

## 2021-02-15 ENCOUNTER — Telehealth: Payer: Self-pay | Admitting: *Deleted

## 2021-02-15 NOTE — Telephone Encounter (Signed)
Ordered oncotype per Dr. Burr Medico. Sent requisition to pathology.

## 2021-03-02 ENCOUNTER — Telehealth: Payer: Self-pay | Admitting: *Deleted

## 2021-03-02 ENCOUNTER — Encounter: Payer: Self-pay | Admitting: *Deleted

## 2021-03-02 DIAGNOSIS — Z170 Estrogen receptor positive status [ER+]: Secondary | ICD-10-CM

## 2021-03-02 NOTE — Telephone Encounter (Signed)
Received oncotype results of 6/3%.  Patient is aware.  Referral placed for Dr. Lisbeth Renshaw

## 2021-03-03 ENCOUNTER — Encounter: Payer: Self-pay | Admitting: Hematology

## 2021-03-09 ENCOUNTER — Other Ambulatory Visit: Payer: Self-pay

## 2021-03-09 ENCOUNTER — Ambulatory Visit: Payer: BC Managed Care – PPO | Attending: Surgery | Admitting: Physical Therapy

## 2021-03-09 ENCOUNTER — Encounter (HOSPITAL_COMMUNITY): Payer: Self-pay

## 2021-03-09 DIAGNOSIS — R293 Abnormal posture: Secondary | ICD-10-CM | POA: Diagnosis present

## 2021-03-09 DIAGNOSIS — C50412 Malignant neoplasm of upper-outer quadrant of left female breast: Secondary | ICD-10-CM | POA: Insufficient documentation

## 2021-03-09 DIAGNOSIS — Z170 Estrogen receptor positive status [ER+]: Secondary | ICD-10-CM | POA: Insufficient documentation

## 2021-03-09 DIAGNOSIS — Z483 Aftercare following surgery for neoplasm: Secondary | ICD-10-CM | POA: Diagnosis present

## 2021-03-09 NOTE — Patient Instructions (Addendum)
            [facility]         [address], [place] [address]         [phone]         Annia Friendly, PT, CLT   After Breast Cancer Class It is recommended you attend the ABC class to be educated on lymphedema risk reduction. This class is free of charge and lasts for 1 hour. It is a 1-time class.  You are scheduled for August 1st at 11:00. We will send you a link.  Scar massage You can begin gentle scar massage with coconut oil a few minutes each day.  Home exercise Program Continue doing your exercises until you complete radiation so you don't develop shoulder tightness.   Follow up PT: It is recommended you return every 3 months for the first 3 years following surgery to be assessed on the SOZO machine for an L-Dex score. This helps prevent clinically significant lymphedema in 95% of patients. These follow up screens are 10 minute appointments that you are not billed for. WE ARE SCHEDULED TO MOVE TO [place] June 05, 2021. APPOINTMENTS FOR SOZO SCREENS AFTER 06/05/2021 WILL BE LOCATED AT [facility] AT [address] [place] [address]. Please call us to confirm we have moved if your appointment is scheduled after October 3rd, 2022. The phone number is [phone]. You are scheduled for May 15, 2021 at 4:50pm

## 2021-03-09 NOTE — Therapy (Signed)
[place], [place], [address] Phone: [phone]   Fax:  [phone]  Physical Therapy Treatment  Patient Details  Name: Brandi Phelps MRN: [id number] Date of Birth: [birth year] Referring Provider (PT): Dr. Coralie Keens   Encounter Date: 03/09/2021   PT End of Session - 03/09/21 1047     Visit Number 2    Number of Visits 2    PT Start Time 6659    PT Stop Time 1047    PT Time Calculation (min) 33 min    Activity Tolerance Patient tolerated treatment well    Behavior During Therapy [facility] for tasks assessed/performed             Past Medical History:  Diagnosis Date   Cancer ([place]) 01/2021   left breast IDC in situ   Complication of anesthesia    Family history of adverse reaction to anesthesia    Family history of colon cancer    Family history of pancreatic cancer    Family history of prostate cancer    Grover's disease     Past Surgical History:  Procedure Laterality Date   BREAST LUMPECTOMY WITH RADIOACTIVE SEED AND SENTINEL LYMPH NODE BIOPSY Left 02/10/2021   Procedure: LEFT BREAST LUMPECTOMY WITH RADIOACTIVE SEED AND SENTINEL LYMPH NODE BIOPSY;  Surgeon: Coralie Keens, MD;  Location: [facility];  Service: General;  Laterality: Left;   CESAREAN SECTION     x3    There were no vitals filed for this visit.   Subjective Assessment - 03/09/21 1020     Subjective Patient underwent a left lumpcetomy and sentinel node biopsy (3 negative nodes) on 02/10/2021. Her Oncotype score was 6 so no chemo is needed. Radiation appointment is scheduled for 03/14/2021 and will also undergo anti-estrogen therapy. She sees her surgeon on 03/14/2021.    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 She underwent a left lumpcetomy and sentinel node biopsy (3 negative nodes) on 02/10/2021. It is ER/PR positive and HER2  negative with a Ki67 of 10%.    Patient Stated Goals See if my arm has recovered    Currently in Pain? Yes    Pain Score 2     Pain Location Arm    Pain Orientation Left;Upper;Proximal    Pain Descriptors / Indicators Numbness    Pain Type Surgical pain    Pain Onset 1 to 4 weeks ago    Pain Frequency Intermittent    Aggravating Factors  Nothing    Pain Relieving Factors Nothing                [facility] PT Assessment - 03/09/21 0001       Assessment   Medical Diagnosis s/p left lumpectomy and SLNB    Referring Provider (PT) Dr. Coralie Keens    Onset Date/Surgical Date 02/10/21    Hand Dominance Right    Prior Therapy Baselines      Precautions   Precautions Other (comment)    Precaution Comments left arm lymphedema risk      Restrictions   Weight Bearing Restrictions No      Balance Screen   Has the patient fallen in the past 6 months No    Has the patient had a decrease in activity level because of a fear of falling?  No    Is the patient reluctant to leave their home because of a fear of falling?  No      Home Social worker Private residence    Living Arrangements Spouse/significant other;Children   Husband, 72, 66, and 12 y.o. 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 There is palpable scar tissue present but no significant edema.      Posture/Postural Control   Posture/Postural Control Postural limitations    Postural Limitations Rounded Shoulders;Forward head      ROM / Strength   AROM / PROM / Strength AROM      AROM    AROM Assessment Site Shoulder    Right/Left Shoulder Left    Right Shoulder Extension 70 Degrees    Right Shoulder Flexion 145 Degrees    Right Shoulder ABduction 160 Degrees    Right Shoulder Internal Rotation 68 Degrees    Right Shoulder External Rotation 90 Degrees               LYMPHEDEMA/ONCOLOGY QUESTIONNAIRE - 03/09/21 0001       Type   Cancer Type Left breast cancer      Surgeries   Lumpectomy Date 02/10/21    Sentinel Lymph Node Biopsy Date 02/10/21    Number Lymph Nodes Removed 3      Treatment   Active Chemotherapy Treatment No    Past Chemotherapy Treatment No    Active Radiation Treatment No    Past Radiation Treatment No    Current Hormone Treatment No    Past Hormone Therapy No      What other symptoms do you have   Are you Having Heaviness or Tightness No    Are you having Pain Yes    Are you having pitting edema No    Is it Hard or Difficult finding clothes that fit No    Do you have infections No    Is there Decreased scar mobility No    Stemmer Sign No      Lymphedema Assessments   Lymphedema Assessments Upper extremities      Right Upper Extremity Lymphedema   10 cm Proximal to Olecranon Process 29 cm    Olecranon Process 25.2 cm    10 cm Proximal to Ulnar Styloid Process 23.6 cm    Just Proximal to Ulnar Styloid Process 15.8 cm    Across Hand at [employer] 20 cm    At [place] of 2nd Digit 5.9 cm      Left Upper Extremity Lymphedema   10 cm Proximal to Olecranon Process 29.5 cm    Olecranon Process 25.3 cm    10 cm Proximal to Ulnar Styloid Process 23.6 cm    Just Proximal to Ulnar Styloid Process 15.8 cm    Across Hand at [employer] 20.3 cm    At [place] of 2nd Digit 5.9 cm                Quick Dash - 03/09/21 0001     Open a tight or new jar No difficulty    Do heavy household  chores (wash walls, wash floors) No difficulty    Carry a shopping bag or briefcase No difficulty    Wash your back No difficulty    Use a  knife to cut food No difficulty    Recreational activities in which you take some force or impact through your arm, shoulder, or hand (golf, hammering, tennis) No difficulty    During the past week, to what extent has your arm, shoulder or hand problem interfered with your normal social activities with family, friends, neighbors, or groups? Not at all    During the past week, to what extent has your arm, shoulder or hand problem limited your work or other regular daily activities Not at all    Arm, shoulder, or hand pain. None    Tingling (pins and needles) in your arm, shoulder, or hand None    Difficulty Sleeping No difficulty    DASH Score 0 %                            PT Education - 03/09/21 1046     Education Details Aftercare; scar massage    Person(s) Educated Patient    Methods Demonstration;Explanation;Handout    Comprehension Returned demonstration;Verbalized understanding                 PT Long Term Goals - 03/09/21 1053       PT LONG TERM GOAL #1   Title Patient will demonstrate she has regained full shoulder ROM and function post operatively compared to baselines.    Time 8    Period Weeks    Status Achieved                   Plan - 03/09/21 1047     Clinical Impression Statement Patient is doing very well s/p left lumpectomy and sentinel node biopsy (3 negative nodes) on 02/10/2021. Her oncotype score was low so she will proceed to radiation. She has regained full shoulder ROM and function, shows no signs of lymphedema, and her incision is well healed. She plans to attend the After Breast Cancer class 04/03/2021 and will return for L-Dex screens every 3 months for 2 years to detect subclinical lymphedema. Otherwise, she has no PT needs at this time.    PT Treatment/Interventions ADLs/Self Care Home Management;Therapeutic exercise;Patient/family education    PT Next Visit Plan D/C    PT Home Exercise Plan Post op shoulder ROM HEP     Consulted and Agree with Plan of Care Patient             Patient will benefit from skilled therapeutic intervention in order to improve the following deficits and impairments:  Postural dysfunction, Decreased range of motion, Decreased knowledge of precautions, Impaired UE functional use, Pain  Visit Diagnosis: Malignant neoplasm of upper-outer quadrant of left breast in female, estrogen receptor positive (HCC)  Abnormal posture  Aftercare following surgery for neoplasm     Problem List Patient Active Problem List   Diagnosis Date Noted   Family history of prostate cancer    Family history of colon cancer    Family history of pancreatic cancer    Malignant neoplasm of upper-outer quadrant of left breast in female, estrogen receptor positive ([place]) 01/10/2021   PHYSICAL THERAPY DISCHARGE SUMMARY  Visits from Start of Care: 2  Current functional level related to goals / functional outcomes: Goals met. See above for objective measurements.  Remaining deficits: none  Education / Equipment: Lymphedema education and HEP  Patient agrees to discharge. Patient goals were met. Patient is being discharged due to meeting the stated rehab goals.  Annia Friendly, [place] 03/09/21 10:55 AM    [place], [place], [address] Phone: [phone]   Fax:  [phone]  Name: Brandi Phelps MRN: [id number] Date of Birth: 1970-09-26

## 2021-03-13 ENCOUNTER — Encounter: Payer: Self-pay | Admitting: *Deleted

## 2021-03-14 ENCOUNTER — Ambulatory Visit
Admission: RE | Admit: 2021-03-14 | Discharge: 2021-03-14 | Disposition: A | Discharge status: Home / Self Care | Payer: BC Managed Care – PPO | Source: Ambulatory Visit | Attending: Radiation Oncology | Admitting: Radiation Oncology

## 2021-03-14 ENCOUNTER — Encounter: Payer: Self-pay | Admitting: Radiation Oncology

## 2021-03-14 ENCOUNTER — Other Ambulatory Visit: Payer: Self-pay

## 2021-03-14 VITALS — BP 102/78 | HR 83 | Temp 97.5°F | Resp 20

## 2021-03-14 DIAGNOSIS — C50412 Malignant neoplasm of upper-outer quadrant of left female breast: Secondary | ICD-10-CM | POA: Diagnosis present

## 2021-03-14 DIAGNOSIS — L111 Transient acantholytic dermatosis [Grover]: Secondary | ICD-10-CM | POA: Diagnosis not present

## 2021-03-14 DIAGNOSIS — Z510 Encounter for antineoplastic radiation therapy: Secondary | ICD-10-CM | POA: Insufficient documentation

## 2021-03-14 DIAGNOSIS — Z8042 Family history of malignant neoplasm of prostate: Secondary | ICD-10-CM | POA: Insufficient documentation

## 2021-03-14 DIAGNOSIS — Z800 Family history of malignant neoplasm of digestive organs: Secondary | ICD-10-CM | POA: Diagnosis not present

## 2021-03-14 DIAGNOSIS — Z170 Estrogen receptor positive status [ER+]: Secondary | ICD-10-CM | POA: Insufficient documentation

## 2021-03-14 LAB — PREGNANCY, URINE: Preg Test, Ur: NEGATIVE

## 2021-03-14 NOTE — Progress Notes (Signed)
Radiation Oncology         (336) [phone] ________________________________  Name: Brandi Phelps        MRN: [id number]  Date of Service: 03/14/2021 DOB: 1971/03/04  WC:HENIDPOEUM, Raquel Sarna, FNP  Truitt Merle, MD     REFERRING PHYSICIAN: Truitt Merle, MD   DIAGNOSIS: The encounter diagnosis was Malignant neoplasm of upper-outer quadrant of left breast in female, estrogen receptor positive ([place]).   HISTORY OF PRESENT ILLNESS: Brandi Phelps is a 50 y.o. female was originally seen in the multidisciplinary breast clinic for a new diagnosis of left breast cancer. The patient was noted to have screening detected mass in the 1:30 position. Diagnostic imaging revealed this to measure 8 mm and her axilla was negative. She underwent a biopsy that showed a grade 1-2 invasive ductal carcinoma with associated DCIS. Her cancer was ER/PR positive, and Her2 was negative, Ki 67 was 10%.   Since her last visit she is undergone left lumpectomy with sentinel lymph node biopsy on 02/10/2021 which revealed a grade 2 invasive ductal carcinoma measuring 8 mm with associated intermediate grade DCIS, margins were clear, 3 sampled sentinel lymph nodes were removed and all were negative for disease.  Oncotype score was performed and was 6 so no systemic therapy is required.  She is seen today to discuss adjuvant radiotherapy.    PREVIOUS RADIATION THERAPY: No   PAST MEDICAL HISTORY:  Past Medical History:  Diagnosis Date   Cancer ([place]) 01/2021   left breast IDC in situ   Complication of anesthesia    Family history of adverse reaction to anesthesia    Family history of colon cancer    Family history of pancreatic cancer    Family history of prostate cancer    Grover's disease        PAST SURGICAL HISTORY:  Past Surgical History:  Procedure Laterality Date   BREAST LUMPECTOMY WITH RADIOACTIVE SEED AND SENTINEL LYMPH NODE BIOPSY Left 02/10/2021   Procedure: LEFT BREAST LUMPECTOMY WITH RADIOACTIVE SEED AND  SENTINEL LYMPH NODE BIOPSY;  Surgeon: Coralie Keens, MD;  Location: [facility];  Service: General;  Laterality: Left;   CESAREAN SECTION     x3     FAMILY HISTORY:  Family History  Problem Relation Age of Onset   Prostate cancer Father 32   Myelodysplastic syndrome Father 9   Prostate cancer Paternal Uncle    Colon cancer Maternal Uncle        dx 69s   Cancer Paternal Grandfather        mouth or throat, dx 70s/80s, smoker   Pancreatic cancer Maternal Uncle        dx 62s     SOCIAL HISTORY:  reports that she quit smoking about 27 years ago. Her smoking use included cigarettes. She has a 2.50 pack-year smoking history. She has never used smokeless tobacco. She reports previous alcohol use. She reports that she does not use drugs. The patient is married and lives in [place]. 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 She has three teenage children. Her husband was previously treated with H&N cancer at [facility].   ALLERGIES: Methotrexate derivatives   MEDICATIONS:  Current Outpatient Medications  Medication Sig Dispense Refill   ibuprofen (ADVIL) 200 MG tablet Take 200 mg by mouth every 6 (six) hours as needed.     Multiple Vitamins-Minerals (AIRBORNE GUMMIES PO) Take by mouth.     oxyCODONE (OXY IR/ROXICODONE) 5 MG immediate release tablet Take 1  tablet (5 mg total) by mouth every 6 (six) hours as needed for moderate pain, severe pain or breakthrough pain. 25 tablet 0   No current facility-administered medications for this encounter.     REVIEW OF SYSTEMS: On review of systems, the patient reports that she is doing well. Since our last visit her IUD was removed and she had a either a menstrual period or withdrawal bleed following this. She has been sexually active with condoms. She reports no additional cycle since the one on 02/03/21. She reports she is healing well and no specific complaints are verbalized.      PHYSICAL EXAM:  Wt Readings from Last 3  Encounters:  02/10/21 189 lb 9.5 oz (86 kg)  01/11/21 189 lb 14.4 oz (86.1 kg)   Temp Readings from Last 3 Encounters:  02/10/21 98.7 F (37.1 C)  01/11/21 97.7 F (36.5 C)   BP Readings from Last 3 Encounters:  02/10/21 113/70  01/11/21 112/60   Pulse Readings from Last 3 Encounters:  02/10/21 77  01/11/21 83    In general this is a well appearing caucasian female in no acute distress. She's alert and oriented x4 and appropriate throughout the examination. Cardiopulmonary assessment is negative for acute distress and she exhibits normal effort.  Her left breast is evaluated and reveals a well-healed incision site without erythema separation or bleeding or drainage.   ECOG = 0  0 - Asymptomatic (Fully active, able to carry on all predisease activities without restriction)  1 - Symptomatic but completely ambulatory (Restricted in physically strenuous activity but ambulatory and able to carry out work of a light or sedentary nature. For example, light housework, office work)  2 - Symptomatic, <50% in bed during the day (Ambulatory and capable of all self care but unable to carry out any work activities. Up and about more than 50% of waking hours)  3 - Symptomatic, >50% in bed, but not bedbound (Capable of only limited self-care, confined to bed or chair 50% or more of waking hours)  4 - Bedbound (Completely disabled. Cannot carry on any self-care. Totally confined to bed or chair)  5 - Death   Eustace Pen MM, Creech RH, Tormey DC, et al. [phone]). "Toxicity and response criteria of the [facility]". [place] Oncol. 5 (6): 649-55    LABORATORY DATA:  Lab Results  Component Value Date   WBC 5.0 01/11/2021   HGB 13.4 01/11/2021   HCT 41.0 01/11/2021   MCV 93.0 01/11/2021   PLT 268 01/11/2021   Lab Results  Component Value Date   NA 141 01/11/2021   K 3.4 (L) 01/11/2021   CL 103 01/11/2021   CO2 27 01/11/2021   Lab Results  Component Value Date    ALT 9 01/11/2021   AST 14 (L) 01/11/2021   ALKPHOS 66 01/11/2021   BILITOT 0.7 01/11/2021      RADIOGRAPHY: No results found.      IMPRESSION/PLAN: 1. Stage IA, [id number] grade 2 ER/PR positive invasive ductal carcinoma of the left breast. Dr. Lisbeth Renshaw reviews her final pathology.  Fortunately she does not need systemic chemotherapy.  Dr. Lisbeth Renshaw recommends external radiotherapy to the breast  to reduce risks of local recurrence followed by antiestrogen therapy. We discussed the risks, benefits, short, and long term effects of radiotherapy, as well as the curative intent, and the patient is interested in proceeding. Dr. Lisbeth Renshaw discusses the delivery and logistics of radiotherapy and anticipates a course 4 weeks of radiotherapy  to the left breast with deep inspiration breath-hold technique. Written consent is obtained and placed in the chart, a copy was provided to the patient.  She will simulate tomorrow afternoon. 2.  Contraceptive Counseling. The patient was told last summer she still has ovarian function. She is aware of the need to avoid pregnancy during radiotherapy and will have a urine pregnancy test today. She will continue using condoms with her partner during treatment as well. 3. Grover's Disease. The patient is aware that this is not a known condition that would prohibit the use of radiotherapy. She will keep [place] informed of concerns with her skin prior to treatment.   In a visit lasting 45 minutes, greater than 50% of the time was spent face to face reviewing her case, as well as in preparation of, discussing, and coordinating the patient's care.  The above documentation reflects my direct findings during this shared patient visit. Please see the separate note by Dr. Lisbeth Renshaw on this date for the remainder of the patient's plan of care.    Carola Rhine, PAC   Addendum: I called the patient as well to let her know her pregnancy test was negative.     Carola Rhine,  [facility]    **Disclaimer: This note was dictated with voice recognition software. Similar sounding words can inadvertently be transcribed and this note may contain transcription errors which may not have been corrected upon publication of note.**

## 2021-03-14 NOTE — Progress Notes (Signed)
Patient denies having a pace marker and states that she is not pregnant  .

## 2021-03-15 ENCOUNTER — Ambulatory Visit
Admission: RE | Admit: 2021-03-15 | Discharge: 2021-03-15 | Disposition: A | Discharge status: Home / Self Care | Payer: BC Managed Care – PPO | Source: Ambulatory Visit | Attending: Radiation Oncology | Admitting: Radiation Oncology

## 2021-03-15 DIAGNOSIS — C50412 Malignant neoplasm of upper-outer quadrant of left female breast: Secondary | ICD-10-CM | POA: Diagnosis not present

## 2021-03-20 ENCOUNTER — Encounter: Payer: Self-pay | Admitting: *Deleted

## 2021-03-20 DIAGNOSIS — C50412 Malignant neoplasm of upper-outer quadrant of left female breast: Secondary | ICD-10-CM | POA: Diagnosis not present

## 2021-03-21 ENCOUNTER — Telehealth: Payer: Self-pay | Admitting: Hematology

## 2021-03-21 NOTE — Telephone Encounter (Signed)
Scheduled appt per 7/18 sch msg. Pt aware.

## 2021-03-27 ENCOUNTER — Ambulatory Visit
Admission: RE | Admit: 2021-03-27 | Discharge: 2021-03-27 | Disposition: A | Discharge status: Home / Self Care | Payer: BC Managed Care – PPO | Source: Ambulatory Visit | Attending: Radiation Oncology | Admitting: Radiation Oncology

## 2021-03-27 ENCOUNTER — Other Ambulatory Visit: Payer: Self-pay

## 2021-03-27 DIAGNOSIS — C50412 Malignant neoplasm of upper-outer quadrant of left female breast: Secondary | ICD-10-CM | POA: Diagnosis not present

## 2021-03-28 ENCOUNTER — Ambulatory Visit
Admission: RE | Admit: 2021-03-28 | Discharge: 2021-03-28 | Disposition: A | Discharge status: Home / Self Care | Payer: BC Managed Care – PPO | Source: Ambulatory Visit | Attending: Radiation Oncology | Admitting: Radiation Oncology

## 2021-03-28 DIAGNOSIS — C50412 Malignant neoplasm of upper-outer quadrant of left female breast: Secondary | ICD-10-CM | POA: Diagnosis not present

## 2021-03-29 ENCOUNTER — Encounter: Payer: Self-pay | Admitting: Radiation Oncology

## 2021-03-29 ENCOUNTER — Ambulatory Visit: Admission: RE | Admit: 2021-03-29 | Payer: BC Managed Care – PPO | Source: Ambulatory Visit

## 2021-03-29 ENCOUNTER — Other Ambulatory Visit: Payer: Self-pay

## 2021-03-30 ENCOUNTER — Ambulatory Visit
Admission: RE | Admit: 2021-03-30 | Discharge: 2021-03-30 | Disposition: A | Discharge status: Home / Self Care | Payer: BC Managed Care – PPO | Source: Ambulatory Visit | Attending: Radiation Oncology | Admitting: Radiation Oncology

## 2021-03-30 DIAGNOSIS — C50412 Malignant neoplasm of upper-outer quadrant of left female breast: Secondary | ICD-10-CM | POA: Diagnosis not present

## 2021-03-30 NOTE — Progress Notes (Signed)
Pt here for patient teaching.  Pt given Radiation and You booklet, skin care instructions, Alra deodorant, and Radiaplex gel.  Reviewed areas of pertinence such as fatigue, hair loss, skin changes, breast tenderness, and breast swelling . Pt able to give teach back of to pat skin and use unscented/gentle soap,apply Radiaplex bid, avoid applying anything to skin within 4 hours of treatment, avoid wearing an under wire bra, and to use an electric razor if they must shave. Pt verbalizes understanding of information given and will contact nursing with any questions or concerns.     Http://rtanswers.org/treatmentinformation/whattoexpect/index  Nyiesha Beever M. Tremel Setters RN, BSN       

## 2021-03-31 ENCOUNTER — Ambulatory Visit
Admission: RE | Admit: 2021-03-31 | Discharge: 2021-03-31 | Disposition: A | Discharge status: Home / Self Care | Payer: BC Managed Care – PPO | Source: Ambulatory Visit | Attending: Radiation Oncology | Admitting: Radiation Oncology

## 2021-03-31 ENCOUNTER — Other Ambulatory Visit: Payer: Self-pay

## 2021-03-31 DIAGNOSIS — Z170 Estrogen receptor positive status [ER+]: Secondary | ICD-10-CM

## 2021-03-31 DIAGNOSIS — C50412 Malignant neoplasm of upper-outer quadrant of left female breast: Secondary | ICD-10-CM | POA: Diagnosis not present

## 2021-03-31 MED ORDER — ALRA NON-METALLIC DEODORANT (RAD-ONC)
1.0000 "application " | Freq: Once | TOPICAL | Status: AC
  Administered 2021-03-31: 1 via TOPICAL

## 2021-03-31 MED ORDER — RADIAPLEXRX EX GEL: Freq: Once | CUTANEOUS | Status: AC

## 2021-04-03 ENCOUNTER — Ambulatory Visit
Admission: RE | Admit: 2021-04-03 | Discharge: 2021-04-03 | Disposition: A | Discharge status: Home / Self Care | Payer: BC Managed Care – PPO | Source: Ambulatory Visit | Attending: Radiation Oncology | Admitting: Radiation Oncology

## 2021-04-03 DIAGNOSIS — C50412 Malignant neoplasm of upper-outer quadrant of left female breast: Secondary | ICD-10-CM | POA: Diagnosis present

## 2021-04-03 DIAGNOSIS — Z510 Encounter for antineoplastic radiation therapy: Secondary | ICD-10-CM | POA: Insufficient documentation

## 2021-04-03 DIAGNOSIS — Z170 Estrogen receptor positive status [ER+]: Secondary | ICD-10-CM | POA: Diagnosis not present

## 2021-04-04 ENCOUNTER — Ambulatory Visit
Admission: RE | Admit: 2021-04-04 | Discharge: 2021-04-04 | Disposition: A | Discharge status: Home / Self Care | Payer: BC Managed Care – PPO | Source: Ambulatory Visit | Attending: Radiation Oncology | Admitting: Radiation Oncology

## 2021-04-04 DIAGNOSIS — C50412 Malignant neoplasm of upper-outer quadrant of left female breast: Secondary | ICD-10-CM | POA: Diagnosis not present

## 2021-04-05 ENCOUNTER — Ambulatory Visit
Admission: RE | Admit: 2021-04-05 | Discharge: 2021-04-05 | Disposition: A | Discharge status: Home / Self Care | Payer: BC Managed Care – PPO | Source: Ambulatory Visit | Attending: Radiation Oncology | Admitting: Radiation Oncology

## 2021-04-05 ENCOUNTER — Other Ambulatory Visit: Payer: Self-pay

## 2021-04-05 DIAGNOSIS — C50412 Malignant neoplasm of upper-outer quadrant of left female breast: Secondary | ICD-10-CM | POA: Diagnosis not present

## 2021-04-06 ENCOUNTER — Ambulatory Visit
Admission: RE | Admit: 2021-04-06 | Discharge: 2021-04-06 | Disposition: A | Discharge status: Home / Self Care | Payer: BC Managed Care – PPO | Source: Ambulatory Visit | Attending: Radiation Oncology | Admitting: Radiation Oncology

## 2021-04-06 DIAGNOSIS — C50412 Malignant neoplasm of upper-outer quadrant of left female breast: Secondary | ICD-10-CM | POA: Diagnosis not present

## 2021-04-07 ENCOUNTER — Other Ambulatory Visit: Payer: Self-pay

## 2021-04-07 ENCOUNTER — Ambulatory Visit
Admission: RE | Admit: 2021-04-07 | Discharge: 2021-04-07 | Disposition: A | Discharge status: Home / Self Care | Payer: BC Managed Care – PPO | Source: Ambulatory Visit | Attending: Radiation Oncology | Admitting: Radiation Oncology

## 2021-04-07 DIAGNOSIS — C50412 Malignant neoplasm of upper-outer quadrant of left female breast: Secondary | ICD-10-CM | POA: Diagnosis not present

## 2021-04-10 ENCOUNTER — Ambulatory Visit
Admission: RE | Admit: 2021-04-10 | Discharge: 2021-04-10 | Disposition: A | Discharge status: Home / Self Care | Payer: BC Managed Care – PPO | Source: Ambulatory Visit | Attending: Radiation Oncology | Admitting: Radiation Oncology

## 2021-04-10 ENCOUNTER — Other Ambulatory Visit: Payer: Self-pay

## 2021-04-10 DIAGNOSIS — C50412 Malignant neoplasm of upper-outer quadrant of left female breast: Secondary | ICD-10-CM | POA: Diagnosis not present

## 2021-04-11 ENCOUNTER — Ambulatory Visit
Admission: RE | Admit: 2021-04-11 | Discharge: 2021-04-11 | Disposition: A | Discharge status: Home / Self Care | Payer: BC Managed Care – PPO | Source: Ambulatory Visit | Attending: Radiation Oncology | Admitting: Radiation Oncology

## 2021-04-11 DIAGNOSIS — C50412 Malignant neoplasm of upper-outer quadrant of left female breast: Secondary | ICD-10-CM | POA: Diagnosis not present

## 2021-04-12 ENCOUNTER — Ambulatory Visit
Admission: RE | Admit: 2021-04-12 | Discharge: 2021-04-12 | Disposition: A | Discharge status: Home / Self Care | Payer: BC Managed Care – PPO | Source: Ambulatory Visit | Attending: Radiation Oncology | Admitting: Radiation Oncology

## 2021-04-12 DIAGNOSIS — C50412 Malignant neoplasm of upper-outer quadrant of left female breast: Secondary | ICD-10-CM | POA: Diagnosis not present

## 2021-04-13 ENCOUNTER — Other Ambulatory Visit: Payer: Self-pay

## 2021-04-13 ENCOUNTER — Ambulatory Visit
Admission: RE | Admit: 2021-04-13 | Discharge: 2021-04-13 | Disposition: A | Discharge status: Home / Self Care | Payer: BC Managed Care – PPO | Source: Ambulatory Visit | Attending: Radiation Oncology | Admitting: Radiation Oncology

## 2021-04-13 ENCOUNTER — Inpatient Hospital Stay: Payer: BC Managed Care – PPO | Attending: Hematology | Admitting: Hematology

## 2021-04-13 VITALS — BP 102/67 | HR 76 | Temp 97.9°F | Resp 19 | Ht 67.0 in | Wt 195.5 lb

## 2021-04-13 DIAGNOSIS — C50412 Malignant neoplasm of upper-outer quadrant of left female breast: Secondary | ICD-10-CM | POA: Diagnosis present

## 2021-04-13 DIAGNOSIS — Z923 Personal history of irradiation: Secondary | ICD-10-CM | POA: Diagnosis not present

## 2021-04-13 DIAGNOSIS — Z170 Estrogen receptor positive status [ER+]: Secondary | ICD-10-CM | POA: Insufficient documentation

## 2021-04-13 MED ORDER — TAMOXIFEN CITRATE 20 MG PO TABS: 20.0000 mg | ORAL_TABLET | Freq: Every day | ORAL | 3 refills | Status: DC

## 2021-04-13 NOTE — Progress Notes (Signed)
[facility]   [phone] [phone]   Clinic Follow up Note   Patient Care Team: [name], [place] as PCP - General (Family Medicine) Mauro Kaufmann, RN as Oncology Nurse Navigator Rockwell Germany, RN as Oncology Nurse Navigator Coralie Keens, MD as Consulting Physician (General Surgery) Truitt Merle, MD as Consulting Physician (Hematology) Kyung Rudd, MD as Consulting Physician (Radiation Oncology)  Date of Service:  04/13/2021  CHIEF COMPLAINT: f/u of left breast cancer  SUMMARY OF ONCOLOGIC HISTORY: Oncology History Overview Note  Cancer Staging Malignant neoplasm of upper-outer quadrant of left breast in female, estrogen receptor positive ([place]) Staging form: Breast, AJCC 8th Edition - Clinical stage from 01/11/2021: Stage IA (cT1b, cN0, cM0, G2, ER+, PR+, HER2-) - Signed by Truitt Merle, MD on 01/11/2021 Stage prefix: Initial diagnosis Histologic grading system: 3 grade system Laterality: Left Staged by: Pathologist and managing physician Stage used in treatment planning: Yes National guidelines used in treatment planning: Yes Type of national guideline used in treatment planning: NCCN - Pathologic stage from 02/10/2021: Stage IA (pT1b, pN0, cM0, G2, ER+, PR+, HER2-) - Signed by Truitt Merle, MD on 04/12/2021 Stage prefix: Initial diagnosis Histologic grading system: 3 grade system    Malignant neoplasm of upper-outer quadrant of left breast in female, estrogen receptor positive ([place])  01/05/2021 Initial Biopsy   Diagnosis Breast, left, needle core biopsy, upper outer - INVASIVE DUCTAL CARCINOMA, GRADE 1/2. - DUCTAL CARCINOMA IN SITU. Microscopic Comment The greatest tumor dimension is 0.8 cm. A breast prognostic profile will be performed. Dr. Tresa Moore agrees.   01/05/2021 Receptors her2   PROGNOSTIC INDICATORS Results: IMMUNOHISTOCHEMICAL AND MORPHOMETRIC ANALYSIS PERFORMED MANUALLY The tumor cells are NEGATIVE for Her2 (1+). Estrogen  Receptor: 85%, POSITIVE, MODERATE STAINING INTENSITY Progesterone Receptor: 95%, POSITIVE, STRONG STAINING INTENSITY Proliferation Marker Ki67: 10%   01/05/2021 Mammogram   Mammogram  Left breast mass 1:30 position   01/10/2021 Initial Diagnosis   Malignant neoplasm of upper-outer quadrant of left breast in female, estrogen receptor positive ([place])   01/11/2021 Cancer Staging   Staging form: Breast, AJCC 8th Edition - Clinical stage from 01/11/2021: Stage IA (cT1b, cN0, cM0, G2, ER+, PR+, HER2-) - Signed by Truitt Merle, MD on 01/11/2021 Stage prefix: Initial diagnosis Histologic grading system: 3 grade system Laterality: Left Staged by: Pathologist and managing physician Stage used in treatment planning: Yes National guidelines used in treatment planning: Yes Type of national guideline used in treatment planning: NCCN   02/10/2021 Cancer Staging   Staging form: Breast, AJCC 8th Edition - Pathologic stage from 02/10/2021: Stage IA (pT1b, pN0, cM0, G2, ER+, PR+, HER2-) - Signed by Truitt Merle, MD on 04/12/2021 Stage prefix: Initial diagnosis Histologic grading system: 3 grade system   02/10/2021 Pathology Results   FINAL MICROSCOPIC DIAGNOSIS:   A. 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 BREAST, LEFT NEW MEDIAL/INFERIOR MARGIN, EXCISION:  -  No residual carcinoma identified   C. LYMPH NODE, LEFT AXILLARY, SENTINEL, EXCISION:  -  No carcinoma identified in one lymph node (0/1)   D. LYMPH NODE, LEFT AXILLARY, SENTINEL, EXCISION:  -  No carcinoma identified in one lymph node (0/1)   E. LYMPH NODE, LEFT AXILLARY, SENTINEL, EXCISION:  -  No carcinoma  identified in one lymph node (0/1)    02/10/2021 Oncotype testing   Recurrence score of 6, risk of distant  metastasis 3%, no benefit from chemo   03/27/2021 - 04/24/2021 Radiation Therapy   Under Dr. Lisbeth Renshaw      CURRENT THERAPY:  Adjuvant radiation through 04/24/21  INTERVAL HISTORY:  Brandi Phelps is here for a follow up of breast cancer. She was last seen by me on 01/11/21 in consultation. She presents to the clinic alone. She reports she is tolerating radiation well. She notes fatigue and some tenderness.    All other systems were reviewed with the patient and are negative.  MEDICAL HISTORY:  Past Medical History:  Diagnosis Date   Cancer ([place]) 01/2021   left breast IDC in situ   Complication of anesthesia    Family history of adverse reaction to anesthesia    Family history of colon cancer    Family history of pancreatic cancer    Family history of prostate cancer    Grover's disease     SURGICAL HISTORY: Past Surgical History:  Procedure Laterality Date   BREAST LUMPECTOMY WITH RADIOACTIVE SEED AND SENTINEL LYMPH NODE BIOPSY Left 02/10/2021   Procedure: LEFT BREAST LUMPECTOMY WITH RADIOACTIVE SEED AND SENTINEL LYMPH NODE BIOPSY;  Surgeon: Coralie Keens, MD;  Location: [facility];  Service: General;  Laterality: Left;   CESAREAN SECTION     x3    I have reviewed the social history and family history with the patient and they are unchanged from previous note.  ALLERGIES:  is allergic to methotrexate derivatives.  MEDICATIONS:  Current Outpatient Medications  Medication Sig Dispense Refill   AZO-CRANBERRY PO Take 1 capsule by mouth daily.     ibuprofen (ADVIL) 200 MG tablet Take 200 mg by mouth every 6 (six) hours as needed.     Multiple Vitamins-Minerals (AIRBORNE GUMMIES PO) Take by mouth.     tamoxifen (NOLVADEX) 20 MG tablet Take 1 tablet (20 mg total) by mouth daily. 30 tablet 3   No current facility-administered medications for this visit.    PHYSICAL EXAMINATION: ECOG PERFORMANCE STATUS: 0 - Asymptomatic  Vitals:   04/13/21 1357  BP:  102/67  Pulse: 76  Resp: 19  Temp: 97.9 F (36.6 C)  SpO2: 100%   Wt Readings from Last 3 Encounters:  04/13/21 195 lb 8 oz (88.7 kg)  02/10/21 189 lb 9.5 oz (86 kg)  01/11/21 189 lb 14.4 oz (86.1 kg)     GENERAL:alert, no distress and comfortable SKIN: skin color normal, no rashes or significant lesions EYES: normal, Conjunctiva are pink and non-injected, sclera clear  NEURO: alert & oriented x 3 with fluent speech  LABORATORY DATA:  I have reviewed the data as listed CBC Latest Ref Rng & Units 01/11/2021  WBC 4.0 - 10.5 K/uL 5.0  Hemoglobin 12.0 - 15.0 g/dL 13.4  Hematocrit 36.0 - 46.0 % 41.0  Platelets 150 - 400 K/uL 268     CMP Latest Ref Rng & Units 01/11/2021  Glucose 70 - 99 mg/dL 122(H)  BUN 6 - 20 mg/dL 10  Creatinine 0.44 - 1.00 mg/dL 0.72  Sodium 135 - 145 mmol/L 141  Potassium 3.5 - 5.1 mmol/L 3.4(L)  Chloride 98 - 111 mmol/L 103  CO2 22 - 32 mmol/L 27  Calcium 8.9 - 10.3 mg/dL 9.5  Total Protein 6.5 - 8.1 g/dL 7.7  Total Bilirubin 0.3 - 1.2 mg/dL 0.7  Alkaline Phos 38 - 126 U/L 66  AST 15 - 41 U/L 14(L)  ALT 0 - 44 U/L 9      RADIOGRAPHIC STUDIES: I have personally reviewed the radiological images as listed and agreed with the findings in the report. No results found.   ASSESSMENT & PLAN:  Brandi Phelps is a 50 y.o. female with   1. Malignant neoplasm of upper-outer quadrant of left breast, Stage IA, pT1bN0M0, stage IA, ER+/PR+/HER2-, Grade I/II  -She was found to have a 0.8cm left breast mass with biopsy confirmed invasive ductal carcinoma and components of DCIS.  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 -Oncotype DX was obtained on the final surgical sample and the recurrence score of 6 predicts a risk of recurrence outside the breast over the next 9 years of 3%, if the patient takes antiestrogen for 5 years.  It also predicts no  benefit from chemotherapy.  -She proceeded to radiation therapy on 03/27/21 under Dr. Lisbeth Renshaw. She is scheduled to finish on 04/24/21. -Given the strong ER and PR expression and perimenopausal status, I recommend Tamoxifen for 10 years. May recommend switching to Aromatase inhibitor when she becomes postmenopausal. Potential benefits and side effects were discussed with patient and she is interested. I prescribed for her today. ---The potential side effects, which includes but not limited to, hot flash, skin and vaginal dryness, slightly increased risk of cardiovascular disease and cataract, small risk of thrombosis and endometrial cancer, were discussed with her in great details. Preventive strategies for thrombosis, such as being physically active, using compression stocks, avoid cigarette smoking, etc., were reviewed with her. I also recommend her to follow-up with her gynecologist once a year, and watch for vaginal spotting or bleeding, as a clinically sign of endometrial cancer, etc. She voiced good understanding, and agrees to proceed. Will start after she completes adjuvant breast radiation. -We also discussed the breast cancer surveillance after her surgery. She will continue annual screening mammogram, self exam, and a routine office visit with lab and exam with [place]. -complete radiation on 04/24/21. Start tamoxifen about a month after completing radiation. She will see NP Lacie in the survivorship clinic in 3 months. F/u with me in 6 months.   2. IUD -She has used IUD Mirena since 1993 as needed. She had this removed prior to her surgery in 02/2021. -She notes she recently underwent blood test to see if she is in menopause or not. Per her report, she is eligible for another IUD. She has not yet gotten this placed.  3. Genetics -she met with our genetic counselor on 01/11/21 and declined to pursue testing at that time.     PLAN:  -radiation therapy through 04/24/21 -start tamoxifen about a month  later (~05/25/21) -survivorship clinic in 3 months (prefers in person) -f/u in 6 months with lab     No problem-specific Assessment & Plan notes found for this encounter.   No orders of the defined types were placed in this encounter.  All questions were answered. The patient knows to call the clinic with any problems, questions or concerns. No barriers to learning was detected. The total time spent in the appointment was 30 minutes.     Truitt Merle, MD 04/13/2021   I, Wilburn Mylar, am acting as scribe for Truitt Merle, MD.   I have reviewed the above documentation for accuracy and completeness, and I agree with the above.

## 2021-04-14 ENCOUNTER — Ambulatory Visit
Admission: RE | Admit: 2021-04-14 | Discharge: 2021-04-14 | Disposition: A | Discharge status: Home / Self Care | Payer: BC Managed Care – PPO | Source: Ambulatory Visit | Attending: Radiation Oncology | Admitting: Radiation Oncology

## 2021-04-14 ENCOUNTER — Ambulatory Visit: Payer: BC Managed Care – PPO | Admitting: Radiation Oncology

## 2021-04-14 DIAGNOSIS — C50412 Malignant neoplasm of upper-outer quadrant of left female breast: Secondary | ICD-10-CM | POA: Diagnosis not present

## 2021-04-17 ENCOUNTER — Other Ambulatory Visit: Payer: Self-pay

## 2021-04-17 ENCOUNTER — Ambulatory Visit
Admission: RE | Admit: 2021-04-17 | Discharge: 2021-04-17 | Disposition: A | Discharge status: Home / Self Care | Payer: BC Managed Care – PPO | Source: Ambulatory Visit | Attending: Radiation Oncology | Admitting: Radiation Oncology

## 2021-04-17 DIAGNOSIS — C50412 Malignant neoplasm of upper-outer quadrant of left female breast: Secondary | ICD-10-CM | POA: Diagnosis not present

## 2021-04-18 ENCOUNTER — Ambulatory Visit
Admission: RE | Admit: 2021-04-18 | Discharge: 2021-04-18 | Disposition: A | Discharge status: Home / Self Care | Payer: BC Managed Care – PPO | Source: Ambulatory Visit | Attending: Radiation Oncology | Admitting: Radiation Oncology

## 2021-04-18 DIAGNOSIS — C50412 Malignant neoplasm of upper-outer quadrant of left female breast: Secondary | ICD-10-CM | POA: Diagnosis not present

## 2021-04-19 ENCOUNTER — Ambulatory Visit
Admission: RE | Admit: 2021-04-19 | Discharge: 2021-04-19 | Disposition: A | Discharge status: Home / Self Care | Payer: BC Managed Care – PPO | Source: Ambulatory Visit | Attending: Radiation Oncology | Admitting: Radiation Oncology

## 2021-04-19 ENCOUNTER — Other Ambulatory Visit: Payer: Self-pay

## 2021-04-19 DIAGNOSIS — C50412 Malignant neoplasm of upper-outer quadrant of left female breast: Secondary | ICD-10-CM | POA: Diagnosis not present

## 2021-04-20 ENCOUNTER — Encounter: Payer: Self-pay | Admitting: *Deleted

## 2021-04-20 ENCOUNTER — Ambulatory Visit
Admission: RE | Admit: 2021-04-20 | Discharge: 2021-04-20 | Disposition: A | Discharge status: Home / Self Care | Payer: BC Managed Care – PPO | Source: Ambulatory Visit | Attending: Radiation Oncology | Admitting: Radiation Oncology

## 2021-04-20 DIAGNOSIS — C50412 Malignant neoplasm of upper-outer quadrant of left female breast: Secondary | ICD-10-CM | POA: Diagnosis not present

## 2021-04-21 ENCOUNTER — Ambulatory Visit
Admission: RE | Admit: 2021-04-21 | Discharge: 2021-04-21 | Disposition: A | Discharge status: Home / Self Care | Payer: BC Managed Care – PPO | Source: Ambulatory Visit | Attending: Radiation Oncology | Admitting: Radiation Oncology

## 2021-04-21 DIAGNOSIS — C50412 Malignant neoplasm of upper-outer quadrant of left female breast: Secondary | ICD-10-CM | POA: Diagnosis not present

## 2021-04-24 ENCOUNTER — Encounter: Payer: Self-pay | Admitting: *Deleted

## 2021-04-24 ENCOUNTER — Other Ambulatory Visit: Payer: Self-pay

## 2021-04-24 ENCOUNTER — Ambulatory Visit
Admission: RE | Admit: 2021-04-24 | Discharge: 2021-04-24 | Disposition: A | Discharge status: Home / Self Care | Payer: BC Managed Care – PPO | Source: Ambulatory Visit | Attending: Radiation Oncology | Admitting: Radiation Oncology

## 2021-04-24 ENCOUNTER — Encounter: Payer: Self-pay | Admitting: Radiation Oncology

## 2021-04-24 DIAGNOSIS — Z170 Estrogen receptor positive status [ER+]: Secondary | ICD-10-CM

## 2021-04-24 DIAGNOSIS — C50412 Malignant neoplasm of upper-outer quadrant of left female breast: Secondary | ICD-10-CM | POA: Diagnosis not present

## 2021-04-25 NOTE — Progress Notes (Signed)
                                                                                                                                                             Patient Name: Brandi Phelps MRN: [id number] DOB: 11-12-1970 Referring Physician: [name] (Profile Not Attached) Date of Service: 04/24/2021 [facility] [place], [place]                                                        End Of Treatment Note  Diagnoses: C50.412-Malignant neoplasm of upper-outer quadrant of left female breast  Cancer Staging: Stage IA, pT1bN0M0 grade 2 ER/PR positive invasive ductal carcinoma of the left breast  Intent: Curative  Radiation Treatment Dates: 03/27/2021 through 04/24/2021 Site Technique Total Dose (Gy) Dose per Fx (Gy) Completed Fx Beam Energies  Breast, Left: Breast_Lt 3D 42.56/42.56 2.66 16/16 6X  Breast, Left: Breast_Lt_Bst 3D 8/8 2 4/4 6X, 10X   Narrative: The patient tolerated radiation therapy relatively well. She developed anticipated skin changes in the treatment field.   Plan: The patient will receive a call in about one month from the radiation oncology department. She will continue follow up with Dr. Burr Medico as well.   ________________________________________________    Carola Rhine, [facility]

## 2021-04-27 NOTE — Progress Notes (Signed)
  Radiation Oncology         [phone] ________________________________  Name: Brandi Phelps MRN: [id number]  Date: 03/29/2021  DOB: 01-Dec-1970  SIMULATION NOTE   NARRATIVE:  The patient underwent simulation today for ongoing radiation therapy.  The existing CT study set was employed for the purpose of virtual treatment planning.  The target and avoidance structures were reviewed and modified as necessary.  Treatment planning then occurred.  The radiation boost prescription was entered and confirmed.  A total of 3 complex treatment devices were fabricated in the form of multi-leaf collimators to shape radiation around the targets while maximally excluding nearby normal structures. I have requested : Isodose Plan.    PLAN:  This modified radiation beam arrangement is intended to continue the current radiation dose to an additional 8 Gy in 4 fractions for a total cumulative dose of 50.56 Gy.    ------------------------------------------------  [name], MD, PhD

## 2021-05-15 ENCOUNTER — Other Ambulatory Visit: Payer: Self-pay

## 2021-05-15 ENCOUNTER — Ambulatory Visit: Payer: BC Managed Care – PPO | Attending: Surgery

## 2021-05-15 VITALS — Wt 194.0 lb

## 2021-05-15 DIAGNOSIS — Z483 Aftercare following surgery for neoplasm: Secondary | ICD-10-CM | POA: Insufficient documentation

## 2021-05-15 NOTE — Therapy (Signed)
[place], [place], [address] Phone: [phone]   Fax:  [phone]  Physical Therapy Treatment  Patient Details  Name: Brandi Phelps MRN: [id number] Date of Birth: 07/03/1971 Referring Provider (PT): Dr. Coralie Keens   Encounter Date: 05/15/2021   PT End of Session - 05/15/21 1711     Visit Number 2   # unchanged due to screen only   Number of Visits 2    PT Start Time 1707    PT Stop Time 1711    PT Time Calculation (min) 4 min    Activity Tolerance Patient tolerated treatment well    Behavior During Therapy [facility] for tasks assessed/performed             Past Medical History:  Diagnosis Date   Cancer ([place]) 01/2021   left breast IDC in situ   Complication of anesthesia    Family history of adverse reaction to anesthesia    Family history of colon cancer    Family history of pancreatic cancer    Family history of prostate cancer    Grover's disease     Past Surgical History:  Procedure Laterality Date   BREAST LUMPECTOMY WITH RADIOACTIVE SEED AND SENTINEL LYMPH NODE BIOPSY Left 02/10/2021   Procedure: LEFT BREAST LUMPECTOMY WITH RADIOACTIVE SEED AND SENTINEL LYMPH NODE BIOPSY;  Surgeon: Coralie Keens, MD;  Location: [facility];  Service: General;  Laterality: Left;   CESAREAN SECTION     x3    Vitals:   05/15/21 1708  Weight: 194 lb (88 kg)     Subjective Assessment - 05/15/21 1709     Subjective Pt returns for her 3 month L-Dex screen.    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 She underwent a left lumpcetomy and sentinel node biopsy (3 negative nodes) on 02/10/2021. It is ER/PR positive and HER2 negative with a Ki67 of 10%.                    L-DEX FLOWSHEETS - 05/15/21 1700       L-DEX LYMPHEDEMA SCREENING   Measurement Type Unilateral    L-DEX MEASUREMENT  EXTREMITY Upper Extremity    POSITION  Standing    DOMINANT SIDE Right    At Risk Side Left    BASELINE SCORE (UNILATERAL) -0.6    L-DEX SCORE (UNILATERAL) 1.5    VALUE CHANGE (UNILAT) 2.1                                     PT Long Term Goals - 03/09/21 1053       PT LONG TERM GOAL #1   Title Patient will demonstrate she has regained full shoulder ROM and function post operatively compared to baselines.    Time 8    Period Weeks    Status Achieved                   Plan - 05/15/21 1711     Clinical Impression Statement Pt returns for her 3 month L-Dex screen. Her change from baseline of 2.1 is WNLs so no further treatment is required at this time except to cont every 3 month L-Dex screens which pt is agreeable to.    PT Next Visit Plan Cont every 3 month L-Dex screens for up to 2  years from her SLNB.    Consulted and Agree with Plan of Care Patient             Patient will benefit from skilled therapeutic intervention in order to improve the following deficits and impairments:     Visit Diagnosis: Aftercare following surgery for neoplasm     Problem List Patient Active Problem List   Diagnosis Date Noted   Family history of prostate cancer    Family history of colon cancer    Family history of pancreatic cancer    Malignant neoplasm of upper-outer quadrant of left breast in female, estrogen receptor positive ([place]) 01/10/2021    Otelia Limes, PTA 05/15/2021, 5:14 PM  [place], [place], [address] Phone: [phone]   Fax:  [phone]  Name: THERISA MENNELLA MRN: [id number] Date of Birth: 1971-07-15

## 2021-05-22 ENCOUNTER — Ambulatory Visit
Admission: RE | Admit: 2021-05-22 | Discharge: 2021-05-22 | Disposition: A | Discharge status: Home / Self Care | Payer: BC Managed Care – PPO | Source: Ambulatory Visit | Attending: Radiation Oncology | Admitting: Radiation Oncology

## 2021-05-22 DIAGNOSIS — C50412 Malignant neoplasm of upper-outer quadrant of left female breast: Secondary | ICD-10-CM

## 2021-05-22 DIAGNOSIS — Z170 Estrogen receptor positive status [ER+]: Secondary | ICD-10-CM

## 2021-05-22 NOTE — Progress Notes (Signed)
  Radiation Oncology         [phone] ________________________________  Name: JAMIE HAKALA MRN: [id number]  Date of Service: 05/22/2021  DOB: 1971/06/15  Post Treatment Telephone Note  Diagnosis:   Stage IA, pT1bN0M0 grade 2 ER/PR positive invasive ductal carcinoma of the left breast.  Interval Since Last Radiation:  4 weeks   03/27/2021 through 04/24/2021 Site Technique Total Dose (Gy) Dose per Fx (Gy) Completed Fx Beam Energies  Breast, Left: Breast_Lt 3D 42.56/42.56 2.66 16/16 6X  Breast, Left: Breast_Lt_Bst 3D 8/8 2 4/4 6X, 10X    Narrative:  The patient was contacted today for routine follow-up. During treatment she did very well with radiotherapy and did not have significant desquamation.   Impression/Plan: 1. Stage IA, pT1bN0M0 grade 2 ER/PR positive invasive ductal carcinoma of the left breast. I was unable to reach the patient but left a voicemail and on the message, I  discussed that we would be happy to continue to follow her as needed, but she will also continue to follow up with Dr. Burr Medico in medical oncology. She was counseled on skin care as well as measures to avoid sun exposure to this area.  2. Survivorship. We discussed the importance of survivorship evaluation and encouraged her to attend her upcoming visit with that clinic.      Carola Rhine, PAC

## 2021-07-13 ENCOUNTER — Other Ambulatory Visit: Payer: Self-pay

## 2021-07-13 ENCOUNTER — Encounter: Payer: Self-pay | Admitting: Nurse Practitioner

## 2021-07-13 ENCOUNTER — Inpatient Hospital Stay: Payer: BC Managed Care – PPO | Attending: Hematology | Admitting: Nurse Practitioner

## 2021-07-13 VITALS — BP 107/74 | HR 78 | Temp 97.7°F | Resp 20 | Ht 67.0 in | Wt 199.8 lb

## 2021-07-13 DIAGNOSIS — Z79899 Other long term (current) drug therapy: Secondary | ICD-10-CM | POA: Diagnosis not present

## 2021-07-13 DIAGNOSIS — Z79810 Long term (current) use of selective estrogen receptor modulators (SERMs): Secondary | ICD-10-CM | POA: Insufficient documentation

## 2021-07-13 DIAGNOSIS — C50412 Malignant neoplasm of upper-outer quadrant of left female breast: Secondary | ICD-10-CM | POA: Insufficient documentation

## 2021-07-13 DIAGNOSIS — Z923 Personal history of irradiation: Secondary | ICD-10-CM | POA: Insufficient documentation

## 2021-07-13 DIAGNOSIS — Z170 Estrogen receptor positive status [ER+]: Secondary | ICD-10-CM | POA: Insufficient documentation

## 2021-07-13 DIAGNOSIS — Z87891 Personal history of nicotine dependence: Secondary | ICD-10-CM | POA: Insufficient documentation

## 2021-07-13 MED ORDER — TAMOXIFEN CITRATE 20 MG PO TABS: 20.0000 mg | ORAL_TABLET | Freq: Every day | ORAL | 3 refills | Status: DC

## 2021-07-13 NOTE — Progress Notes (Signed)
CLINIC:  Survivorship   Patient Care Team: Dolleschel, Raquel Sarna, [place] as PCP - General (Family Medicine) Mauro Kaufmann, RN as Oncology Nurse Navigator Rockwell Germany, RN as Oncology Nurse Navigator Coralie Keens, MD as Consulting Physician (General Surgery) Truitt Merle, MD as Consulting Physician (Hematology) Kyung Rudd, MD as Consulting Physician (Radiation Oncology) Alla Feeling, NP as Nurse Practitioner (Nurse Practitioner)   REASON FOR VISIT:  Routine follow-up post-treatment for a recent history of breast cancer.  BRIEF ONCOLOGIC HISTORY:  Oncology History Overview Note  Cancer Staging Malignant neoplasm of upper-outer quadrant of left breast in female, estrogen receptor positive ([place]) Staging form: Breast, AJCC 8th Edition - Clinical stage from 01/11/2021: Stage IA (cT1b, cN0, cM0, G2, ER+, PR+, HER2-) - Signed by Truitt Merle, MD on 01/11/2021 Stage prefix: Initial diagnosis Histologic grading system: 3 grade system Laterality: Left Staged by: Pathologist and managing physician Stage used in treatment planning: Yes National guidelines used in treatment planning: Yes Type of national guideline used in treatment planning: NCCN - Pathologic stage from 02/10/2021: Stage IA (pT1b, pN0, cM0, G2, ER+, PR+, HER2-) - Signed by Truitt Merle, MD on 04/12/2021 Stage prefix: Initial diagnosis Histologic grading system: 3 grade system    Malignant neoplasm of upper-outer quadrant of left breast in female, estrogen receptor positive ([place])  01/05/2021 Initial Biopsy   Diagnosis Breast, left, needle core biopsy, upper outer - INVASIVE DUCTAL CARCINOMA, GRADE 1/2. - DUCTAL CARCINOMA IN SITU. Microscopic Comment The greatest tumor dimension is 0.8 cm. A breast prognostic profile will be performed. Dr. Tresa Moore agrees.   01/05/2021 Receptors her2   PROGNOSTIC INDICATORS Results: IMMUNOHISTOCHEMICAL AND MORPHOMETRIC ANALYSIS PERFORMED MANUALLY The tumor cells are NEGATIVE for Her2  (1+). Estrogen Receptor: 85%, POSITIVE, MODERATE STAINING INTENSITY Progesterone Receptor: 95%, POSITIVE, STRONG STAINING INTENSITY Proliferation Marker Ki67: 10%   01/05/2021 Mammogram   Mammogram  Left breast mass 1:30 position   01/10/2021 Initial Diagnosis   Malignant neoplasm of upper-outer quadrant of left breast in female, estrogen receptor positive ([place])   01/11/2021 Cancer Staging   Staging form: Breast, AJCC 8th Edition - Clinical stage from 01/11/2021: Stage IA (cT1b, cN0, cM0, G2, ER+, PR+, HER2-) - Signed by Truitt Merle, MD on 01/11/2021 Stage prefix: Initial diagnosis Histologic grading system: 3 grade system Laterality: Left Staged by: Pathologist and managing physician Stage used in treatment planning: Yes National guidelines used in treatment planning: Yes Type of national guideline used in treatment planning: NCCN    02/10/2021 Cancer Staging   Staging form: Breast, AJCC 8th Edition - Pathologic stage from 02/10/2021: Stage IA (pT1b, pN0, cM0, G2, ER+, PR+, HER2-) - Signed by Truitt Merle, MD on 04/12/2021 Stage prefix: Initial diagnosis Histologic grading system: 3 grade system    02/10/2021 Pathology Results   FINAL MICROSCOPIC DIAGNOSIS:   A. 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 BREAST, LEFT NEW MEDIAL/INFERIOR MARGIN, EXCISION:  -  No residual carcinoma identified   C. LYMPH NODE, LEFT AXILLARY, SENTINEL, EXCISION:  -  No carcinoma identified in one lymph node (0/1)   D. LYMPH NODE, LEFT AXILLARY, SENTINEL, EXCISION:  -  No carcinoma identified in one lymph node (0/1)   E. LYMPH NODE, LEFT AXILLARY, SENTINEL, EXCISION:  -  No  carcinoma identified in one lymph node (0/1)    02/10/2021 Oncotype testing   Recurrence score of  6, risk of distant metastasis 3%, no benefit from chemo   03/27/2021 - 04/24/2021 Radiation Therapy   Under Dr. Lisbeth Renshaw     INTERVAL HISTORY:  Ms. Rinks presents to the [facility] today for our initial meeting to review her survivorship care plan detailing her treatment course for breast cancer, as well as monitoring long-term side effects of that treatment, education regarding health maintenance, screening, and overall wellness and health promotion.     Overall, Ms. Desire reports feeling quite well since completing her radiation therapy approximately 3 months ago.  Left axilla is still numb with occasional tightness and pulling with high reaching.  She helps coach basketball and there is some discomfort in the left breast when she runs or jumps.  Denies new lump/mass, nipple discharge or inversion, or skin change.  Last month she underwent a CT of the abdomen and pelvis for hematuria that she developed earlier this year but put on hold for breast cancer treatment.  The report noted an adnexal cyst.  She also had an abnormal Pap last year for the first time and is due again, her previous OB/GYN does not work anymore in the center does not do ultrasounds, she has been referred to [facility] but no appointment yet.  She is still having periods that are regular every 20-29 days, heavy for 2 days then light then stops.  Denies abnormal breakthrough bleeding, abdominal pain or bloating.  She has UTI-like symptoms if she drinks soda or sugary beverages like orange juice.  She started tamoxifen in September, tolerating with mild hot flashes at night and some increase situational anxiety.  Denies depression, bone or joint pain, or any other specific complaints.   ONCOLOGY TREATMENT TEAM:  1. Surgeon:  Dr. Ninfa Linden at [facility] 2. Medical Oncologist: Dr. Burr Medico 3. Radiation Oncologist: Dr. Lisbeth Renshaw    PAST MEDICAL/SURGICAL HISTORY:  Past Medical History:   Diagnosis Date   Cancer ([place]) 01/2021   left breast IDC in situ   Complication of anesthesia    Family history of adverse reaction to anesthesia    Family history of colon cancer    Family history of pancreatic cancer    Family history of prostate cancer    Grover's disease    Past Surgical History:  Procedure Laterality Date   BREAST LUMPECTOMY WITH RADIOACTIVE SEED AND SENTINEL LYMPH NODE BIOPSY Left 02/10/2021   Procedure: LEFT BREAST LUMPECTOMY WITH RADIOACTIVE SEED AND SENTINEL LYMPH NODE BIOPSY;  Surgeon: Coralie Keens, MD;  Location: [facility];  Service: General;  Laterality: Left;   CESAREAN SECTION     x3     ALLERGIES:  Allergies  Allergen Reactions   Methotrexate Derivatives Other (See Comments)    migraine     CURRENT MEDICATIONS:  Outpatient Encounter Medications as of 07/13/2021  Medication Sig   ibuprofen (ADVIL) 200 MG tablet Take 200 mg by mouth every 6 (six) hours as needed.   Multiple Vitamins-Minerals (AIRBORNE GUMMIES PO) Take by mouth.   tamoxifen (NOLVADEX) 20 MG tablet Take 1 tablet (20 mg total) by mouth daily.   AZO-CRANBERRY PO Take 1 capsule by mouth daily. (Patient not taking: Reported on 07/13/2021)   No facility-administered encounter medications on file as of 07/13/2021.     ONCOLOGIC FAMILY HISTORY:  Family History  Problem Relation Age of Onset   Prostate cancer  Father 16   Myelodysplastic syndrome Father 63   Prostate cancer Paternal Uncle    Colon cancer Maternal Uncle        dx 46s   Cancer Paternal Grandfather        mouth or throat, dx 70s/80s, smoker   Pancreatic cancer Maternal Uncle        dx 32s     GENETIC COUNSELING/TESTING: Patient declined testing  SOCIAL HISTORY:  TALIANA MERSEREAU is married and lives with her spouse and family in [place], [place].  She has 3 children.  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  She denies  any current alcohol or illicit drug use.  She smoked less than 1 pack/day during her college years, none since   PHYSICAL EXAMINATION:  Vital Signs:   Vitals:   07/13/21 0900  BP: 107/74  Pulse: 78  Resp: 20  Temp: 97.7 F (36.5 C)  SpO2: 100%   Filed Weights   07/13/21 0900  Weight: 199 lb 12.8 oz (90.6 kg)   General: Well-nourished, well-appearing female in no acute distress.   HEENT: Sclerae anicteric.  Lymph: No cervical, supraclavicular, or infraclavicular lymphadenopathy noted on palpation.  Cardiovascular: No leg edema Respiratory: breathing non-labored.  Neuro: No focal deficits. Steady gait.  Psych: Mood and affect normal and appropriate for situation.  Extremities: No edema. MSK: No focal spinal tenderness to palpation.  Full range of motion in bilateral upper extremities Skin: Warm and dry. Breast exam: Breasts are symmetrical without nipple discharge or inversion.  S/p left lumpectomy and radiation, incisions healed well.  Breast with mild erythema and hyperpigmentation.  No palpable mass or nodularity in either breast or axilla that I could appreciate  LABORATORY DATA:  None for this visit.  DIAGNOSTIC IMAGING:  None for this visit.      ASSESSMENT AND PLAN:  Ms.. Majette is a pleasant 50 y.o. female with Stage 1A left breast invasive ductal carcinoma, ER+/PR+/HER2-, diagnosed in 01/2021, treated with lumpectomy, adjuvant radiation therapy, and anti-estrogen therapy with tamoxifen beginning in 05/2021.  She presents to the Survivorship Clinic for our initial meeting and routine follow-up post-completion of treatment for breast cancer.    1. Stage 1A left breast cancer:  Ms. Drozdowski has recovered well from definitive treatment for breast cancer.  Breast exam without clinical concern for recurrence.  She will follow-up with her medical oncologist, Dr. Burr Medico in 10/2021 with history and physical exam per surveillance protocol.  She will continue her anti-estrogen therapy  with tamoxifen. Thus far, she is tolerating well, with minimal side effects. She was instructed to make Dr. Burr Medico or myself aware if she begins to experience any worsening side effects of the medication and I could see her back in clinic to help manage those side effects, as needed. Though the incidence is low, there is an associated risk of endometrial cancer with anti-estrogen therapies like Tamoxifen.  Ms. Nellums was encouraged to contact Dr. Burr Medico or myself with any vaginal bleeding while taking Tamoxifen. Other side effects of Tamoxifen were again reviewed with her as well. Today, a comprehensive survivorship care plan and treatment summary was reviewed with the patient today detailing her breast cancer diagnosis, treatment course, potential late/long-term effects of treatment, appropriate follow-up care with recommendations for the future, and patient education resources.  A copy of this summary, along with a letter will be sent to the patient's primary care provider via In Basket message after today's  visit.    2.  Adnexal lesion: Found on CT abdomen pelvis 06/10/2021 for hematuria work-up, patient showed me the report on her phone from [facility], I do not see the report in care everywhere.  We discussed the presence of benign ovarian cysts in premenopausal women but this should be evaluated further, she has been referred to OB/GYN at [facility], she has the phone number and will call for an appointment.  Continue tamoxifen.   3. Bone health:  Given Ms. Mcmartin's age, perimenopausal status, and antiestrogen therapy with tamoxifen, she does not yet need to begin bone density screening. In the meantime, she was encouraged to increase her consumption of foods rich in calcium, as well as increase her weight-bearing activities.  She was given education on specific activities to promote bone health.  4. Cancer screening:  Due to Ms. Letourneau's personal and family history and her age, she  should receive screening for skin cancers, colon cancer, and gynecologic cancers.  She is overdue for colonoscopy, will request referral from PCP.  She has family history of colon, prostate, and pancreas cancers, she declined genetic testing.  The information and recommendations are listed on the patient's comprehensive care plan/treatment summary and were reviewed in detail with the patient.    5. Health maintenance and wellness promotion: Ms. Vitullo was encouraged to consume 5-7 servings of fruits and vegetables per day. We reviewed the "Nutrition Rainbow" handout. She was also encouraged to engage in moderate to vigorous exercise for 30 minutes per day most days of the week. She was instructed to limit her alcohol consumption and continue to abstain from tobacco use.   6. Support services/counseling: It is not uncommon for this period of the patient's cancer care trajectory to be one of many emotions and stressors.  She does have some situational anxiety/stress, denies depression.  We discussed an opportunity for her to participate in the next session of [facility] ("Finding Your New Normal") support group series designed for patients after they have completed treatment.   Ms. Yore was encouraged to take advantage of our many other support services programs, support groups, and/or counseling in coping with her new life as a cancer survivor after completing anti-cancer treatment.  She was offered support today through active listening and expressive supportive counseling.  She was given information regarding our available services and encouraged to contact me with any questions or for help enrolling in any of our support group/programs.    Dispo:   -Return to cancer center 10/2021 -Mammogram due in 01/2022 -Follow up with surgery as indicated -Follow-up referral to [facility] for adnexal cyst, patient will call but will let us know if she has difficulty getting an appointment,  I cc'd my note and sent a message to MD's -Continue tamoxifen -She is welcome to return back to the Survivorship Clinic at any time; no additional follow-up needed at this time.  -Consider referral back to survivorship as a long-term survivor for continued surveillance  Orders Placed This Encounter  Procedures   MM DIAG BREAST TOMO BILATERAL    Standing Status:   Future    Standing Expiration Date:   07/13/2022    Order Specific Question:   Reason for Exam (SYMPTOM  OR DIAGNOSIS REQUIRED)    Answer:   History of left breast cancer 01/2021 s/p lumpectomy and radiation, on tamoxifen    Order Specific Question:   Is the patient pregnant?    Answer:   No  Order Specific Question:   Preferred imaging location?    Answer:   [facility]     A total of (40) minutes of face-to-face time was spent with this patient with greater than 50% of that time in counseling and care-coordination.   Cira Rue, NP Survivorship Program [facility] [phone]   Note: PRIMARY CARE PROVIDER [name], [place] [phone]

## 2021-07-24 ENCOUNTER — Other Ambulatory Visit: Payer: Self-pay

## 2021-07-24 ENCOUNTER — Encounter: Payer: Self-pay | Admitting: Obstetrics and Gynecology

## 2021-07-24 ENCOUNTER — Other Ambulatory Visit (HOSPITAL_COMMUNITY)
Admission: RE | Admit: 2021-07-24 | Discharge: 2021-07-24 | Disposition: A | Discharge status: Home / Self Care | Payer: BC Managed Care – PPO | Source: Ambulatory Visit | Attending: Obstetrics and Gynecology | Admitting: Obstetrics and Gynecology

## 2021-07-24 ENCOUNTER — Ambulatory Visit: Payer: BC Managed Care – PPO | Admitting: Obstetrics and Gynecology

## 2021-07-24 VITALS — BP 118/62 | HR 63 | Ht 67.0 in | Wt 198.0 lb

## 2021-07-24 DIAGNOSIS — Z124 Encounter for screening for malignant neoplasm of cervix: Secondary | ICD-10-CM | POA: Diagnosis not present

## 2021-07-24 DIAGNOSIS — N949 Unspecified condition associated with female genital organs and menstrual cycle: Secondary | ICD-10-CM

## 2021-07-24 DIAGNOSIS — Z01419 Encounter for gynecological examination (general) (routine) without abnormal findings: Secondary | ICD-10-CM

## 2021-07-24 DIAGNOSIS — R6882 Decreased libido: Secondary | ICD-10-CM

## 2021-07-24 DIAGNOSIS — Z853 Personal history of malignant neoplasm of breast: Secondary | ICD-10-CM

## 2021-07-24 NOTE — Progress Notes (Signed)
50 y.o. [id number] Married White or Caucasian Not Hispanic or Latino female here for evaluation of a recently noted ovarian cyst. She is also due for a pap smear, she had an abnormal pap last year and was told she needed a repeat this year.   She had her mirena IUD in 6/22, then started having regular cycles. No dyspareunia. She c/o a decrease in libido in the last few months, some vaginal dryness. Harder to have an orgasm.  Period Cycle (Days): 28 Period Duration (Days): 3-4 Period Pattern: Regular Menstrual Flow: Heavy Menstrual Control: Tampon, Maxi pad Menstrual Control Change Freq (Hours): 4 Dysmenorrhea: None  The patient was diagnosed with left breast cancer earlier this year. S/P left lumpectomy, SLN biopsy and radiation. She is on tamoxifen.   She had a recent CT scan for evaluation of hematuria, incidental finding of 4.2 cm hypodense lesion inferior to the uterus.  "Likely a benign adnexal cyst". Pelvic ultrasound was recommended.   Patient's last menstrual period was 06/22/2021 (approximate).          Sexually active: Yes.    The current method of family planning is condoms always .    Exercising: Yes.     Coaches basketball and walking  Smoker:  no  Health Maintenance: Pap:  04/22/20 abnormal  History of abnormal Pap:  yes, last year. She isn't sure what the pap was, but was told the hpv was negative.  Her husband has a h/o throat cancer with hpv.  MMG:  hx of breast cancer in 2022 BMD:   none  Colonoscopy: none  TDaP:  over ten years ago.  Gardasil: n/a   reports that she quit smoking about 27 years ago. Her smoking use included cigarettes. She has a 2.50 pack-year smoking history. She has never used smokeless tobacco. She reports that she does not currently use alcohol. She reports that she does not use drugs. 3 kids, 21, 4 and 70.   Past Medical History:  Diagnosis Date   Cancer ([place]) 01/2021   left breast IDC in situ   Complication of anesthesia    Family history of  adverse reaction to anesthesia    Family history of colon cancer    Family history of pancreatic cancer    Family history of prostate cancer    Grover's disease     Past Surgical History:  Procedure Laterality Date   BREAST LUMPECTOMY WITH RADIOACTIVE SEED AND SENTINEL LYMPH NODE BIOPSY Left 02/10/2021   Procedure: LEFT BREAST LUMPECTOMY WITH RADIOACTIVE SEED AND SENTINEL LYMPH NODE BIOPSY;  Surgeon: Coralie Keens, MD;  Location: [facility];  Service: General;  Laterality: Left;   CESAREAN SECTION     x3    Current Outpatient Medications  Medication Sig Dispense Refill   ibuprofen (ADVIL) 200 MG tablet Take 200 mg by mouth every 6 (six) hours as needed.     Multiple Vitamins-Minerals (AIRBORNE GUMMIES PO) Take by mouth.     tamoxifen (NOLVADEX) 20 MG tablet Take 1 tablet (20 mg total) by mouth daily. 90 tablet 3   No current facility-administered medications for this visit.    Family History  Problem Relation Age of Onset   Prostate cancer Father 72   Myelodysplastic syndrome Father 71   Prostate cancer Paternal Uncle    Colon cancer Maternal Uncle        dx 84s   Cancer Paternal Grandfather        mouth or throat, dx 70s/80s, smoker   Pancreatic  cancer Maternal Uncle        dx 37s    Review of Systems  All other systems reviewed and are negative.  Exam:   BP 118/62   Pulse 63   Ht 5\' 7"  (1.702 m)   Wt 198 lb (89.8 kg)   LMP 06/22/2021 (Approximate)   SpO2 100%   BMI 31.01 kg/m   Weight change: @WEIGHTCHANGE @ Height:   Height: 5\' 7"  (170.2 cm)  Ht Readings from Last 3 Encounters:  07/24/21 5\' 7"  (1.702 m)  07/13/21 5\' 7"  (1.702 m)  04/13/21 5\' 7"  (1.702 m)    General appearance: alert, cooperative and appears stated age Head: Normocephalic, without obvious abnormality, atraumatic Neck: no adenopathy, supple, symmetrical, trachea midline and thyroid normal to inspection and palpation Lungs: clear to auscultation bilaterally Cardiovascular:  regular rate and rhythm Breasts: exam deferred, just done with Oncology Abdomen: soft, non-tender; non distended,  no masses,  no organomegaly Extremities: extremities normal, atraumatic, no cyanosis or edema Skin: Skin color, texture, turgor normal. No rashes or lesions Lymph nodes: Cervical, supraclavicular, and axillary nodes normal. No abnormal inguinal nodes palpated Neurologic: Grossly normal   Pelvic: External genitalia:  no lesions              Urethra:  normal appearing urethra with no masses, tenderness or lesions              Bartholins and Skenes: normal                 Vagina: normal appearing vagina with normal color and discharge, no lesions              Cervix: no lesions               Bimanual Exam:  Uterus:   no masses or tenderness              Adnexa: no mass, fullness, tenderness               Rectovaginal: Confirms               Anus:  normal sphincter tone, no lesions  Gae Dry chaperoned for the exam.  1. Well woman exam Colonoscopy has been ordered by her primary She will do labs with her primary  2. Screening for cervical cancer H/o "abnormal pap" last year with negative HPV. Will get a copy of that pap - Cytology - PAP  3. Adnexal cyst Noted on CT during evaluation of her kidneys - US PELVIS TRANSVAGINAL NON-OB (TV ONLY); Future  4. Low libido Information given  5. History of breast cancer On tamoxifen

## 2021-07-24 NOTE — Patient Instructions (Signed)

## 2021-07-30 ENCOUNTER — Telehealth: Payer: Self-pay | Admitting: Obstetrics and Gynecology

## 2021-07-30 DIAGNOSIS — R87611 Atypical squamous cells cannot exclude high grade squamous intraepithelial lesion on cytologic smear of cervix (ASC-H): Secondary | ICD-10-CM

## 2021-07-30 NOTE — Telephone Encounter (Signed)
Please let the patient know that I got a copy of her pap from 03/17/20. It returned with ASC-H with negative hpv. This means there were atypical cells, can't rule out high grade dysplasia. She needs a colpo irregardless of what her pap from last week returns as. Please set this up.

## 2021-07-31 LAB — CYTOLOGY - PAP
Comment: NEGATIVE
Diagnosis: NEGATIVE
High risk HPV: NEGATIVE

## 2021-08-01 ENCOUNTER — Other Ambulatory Visit: Payer: Self-pay

## 2021-08-01 ENCOUNTER — Ambulatory Visit (INDEPENDENT_AMBULATORY_CARE_PROVIDER_SITE_OTHER): Payer: BC Managed Care – PPO

## 2021-08-01 DIAGNOSIS — N949 Unspecified condition associated with female genital organs and menstrual cycle: Secondary | ICD-10-CM | POA: Diagnosis not present

## 2021-08-01 NOTE — Telephone Encounter (Signed)
Patient informed, sent message to appointments to schedule. Order placed.

## 2021-08-01 NOTE — Telephone Encounter (Signed)
Patient scheduled on 08/14/21

## 2021-08-05 ENCOUNTER — Telehealth: Payer: Self-pay | Admitting: Obstetrics and Gynecology

## 2021-08-05 NOTE — Telephone Encounter (Signed)
Mychart message sent.

## 2021-08-14 ENCOUNTER — Ambulatory Visit: Payer: BC Managed Care – PPO | Admitting: Obstetrics and Gynecology

## 2021-08-14 ENCOUNTER — Encounter: Payer: Self-pay | Admitting: Obstetrics and Gynecology

## 2021-08-14 ENCOUNTER — Ambulatory Visit: Payer: BC Managed Care – PPO | Attending: Surgery

## 2021-08-14 ENCOUNTER — Other Ambulatory Visit (HOSPITAL_COMMUNITY)
Admission: RE | Admit: 2021-08-14 | Discharge: 2021-08-14 | Disposition: A | Discharge status: Home / Self Care | Payer: BC Managed Care – PPO | Source: Ambulatory Visit | Attending: Obstetrics and Gynecology | Admitting: Obstetrics and Gynecology

## 2021-08-14 ENCOUNTER — Other Ambulatory Visit: Payer: Self-pay

## 2021-08-14 VITALS — Wt 196.4 lb

## 2021-08-14 VITALS — BP 110/64 | HR 82 | Ht 67.0 in | Wt 198.0 lb

## 2021-08-14 DIAGNOSIS — R87611 Atypical squamous cells cannot exclude high grade squamous intraepithelial lesion on cytologic smear of cervix (ASC-H): Secondary | ICD-10-CM | POA: Insufficient documentation

## 2021-08-14 DIAGNOSIS — Z483 Aftercare following surgery for neoplasm: Secondary | ICD-10-CM | POA: Insufficient documentation

## 2021-08-14 DIAGNOSIS — I890 Lymphedema, not elsewhere classified: Secondary | ICD-10-CM | POA: Insufficient documentation

## 2021-08-14 DIAGNOSIS — R293 Abnormal posture: Secondary | ICD-10-CM | POA: Insufficient documentation

## 2021-08-14 DIAGNOSIS — M25612 Stiffness of left shoulder, not elsewhere classified: Secondary | ICD-10-CM | POA: Insufficient documentation

## 2021-08-14 DIAGNOSIS — Z170 Estrogen receptor positive status [ER+]: Secondary | ICD-10-CM | POA: Insufficient documentation

## 2021-08-14 DIAGNOSIS — C50412 Malignant neoplasm of upper-outer quadrant of left female breast: Secondary | ICD-10-CM | POA: Insufficient documentation

## 2021-08-14 NOTE — Progress Notes (Signed)
GYNECOLOGY  VISIT   HPI: 50 y.o.   Married White or Caucasian Not Hispanic or Latino  female   [phone] with No LMP recorded. (Menstrual status: Perimenopausal).   here for colposcopy. Pap from this year returned as negative with negative hpv, but review of records showed her pap from 03/17/20 [facility], neg hpv (no evaluation).   GYNECOLOGIC HISTORY: No LMP recorded. (Menstrual status: Perimenopausal). Contraception: Condoms  Menopausal hormone therapy: none         OB History     Gravida  3   Para  3   Term  3   Preterm      AB      Living  3      SAB      IAB      Ectopic      Multiple      Live Births  3              Patient Active Problem List   Diagnosis Date Noted   Family history of prostate cancer    Family history of colon cancer    Family history of pancreatic cancer    Malignant neoplasm of upper-outer quadrant of left breast in female, estrogen receptor positive ([place]) 01/10/2021    Past Medical History:  Diagnosis Date   Cancer ([place]) 01/2021   left breast IDC in situ   Complication of anesthesia    Family history of adverse reaction to anesthesia    Family history of colon cancer    Family history of pancreatic cancer    Family history of prostate cancer    Grover's disease     Past Surgical History:  Procedure Laterality Date   BREAST LUMPECTOMY WITH RADIOACTIVE SEED AND SENTINEL LYMPH NODE BIOPSY Left 02/10/2021   Procedure: LEFT BREAST LUMPECTOMY WITH RADIOACTIVE SEED AND SENTINEL LYMPH NODE BIOPSY;  Surgeon: Coralie Keens, MD;  Location: [facility];  Service: General;  Laterality: Left;   CESAREAN SECTION     x3    Current Outpatient Medications  Medication Sig Dispense Refill   ibuprofen (ADVIL) 200 MG tablet Take 200 mg by mouth every 6 (six) hours as needed.     Multiple Vitamins-Minerals (AIRBORNE GUMMIES PO) Take by mouth.     tamoxifen (NOLVADEX) 20 MG tablet Take 1 tablet (20 mg total) by mouth daily. 90  tablet 3   No current facility-administered medications for this visit.     ALLERGIES: Methotrexate derivatives  Family History  Problem Relation Age of Onset   Prostate cancer Father 92   Myelodysplastic syndrome Father 84   Prostate cancer Paternal Uncle    Colon cancer Maternal Uncle        dx 91s   Cancer Paternal Grandfather        mouth or throat, dx 70s/80s, smoker   Pancreatic cancer Maternal Uncle        dx 30s    Social History   Socioeconomic History   Marital status: Married    Spouse name: Not on file   Number of children: Not on file   Years of education: Not on file   Highest education level: Not on file  Occupational History   Not on file  Tobacco Use   Smoking status: Former    Packs/day: 0.50    Years: 5.00    Pack years: 2.50    Types: Cigarettes    Quit date: 01/11/1994    Years since quitting: 27.6   Smokeless  tobacco: Never  Substance and Sexual Activity   Alcohol use: Not Currently   Drug use: Never   Sexual activity: Yes    Birth control/protection: Condom  Other Topics Concern   Not on file  Social History Narrative   Not on file   Social Determinants of Health   Financial Resource Strain: Not on file  Food Insecurity: Not on file  Transportation Needs: Not on file  Physical Activity: Not on file  Stress: Not on file  Social Connections: Not on file  Intimate Partner Violence: Not on file    Review of Systems  All other systems reviewed and are negative.  PHYSICAL EXAMINATION:    There were no vitals taken for this visit.    General appearance: alert, cooperative and appears stated age  Pelvic: External genitalia:  no lesions              Urethra:  normal appearing urethra with no masses, tenderness or lesions              Bartholins and Skenes: normal                 Vagina: normal appearing vagina with normal color and discharge, no lesions              Cervix: no lesions and mildly stenotic.                   Colposcopy: unsatisfactory, no aceto-white changes. Negative lugols examination of the cervix and vagina. Endocervical curettage done  Chaperone was present for exam.  1. Atypical squamous cells cannot exclude high grade squamous intraepithelial lesion on cytologic smear of cervix ([facility]) Unsatisfactory colposcopy, no abnormalities seen.  - Colposcopy - Surgical pathology( [facility]/ [facility])

## 2021-08-14 NOTE — Patient Instructions (Signed)

## 2021-08-14 NOTE — Therapy (Signed)
[facility] @ [address] [place], [place], [address] Phone: [phone]   Fax:  [phone]  Physical Therapy Treatment  Patient Details  Name: ALYDA MEGNA MRN: [id number] Date of Birth: 07/17/1971 Referring Provider (PT): Dr. Coralie Keens   Encounter Date: 08/14/2021   PT End of Session - 08/14/21 1618     Visit Number 2   # unchanged due to screen only   PT Start Time 7741    PT Stop Time 1638    PT Time Calculation (min) 24 min    Activity Tolerance Patient tolerated treatment well    Behavior During Therapy [facility] for tasks assessed/performed             Past Medical History:  Diagnosis Date   Cancer ([place]) 01/2021   left breast IDC in situ   Complication of anesthesia    Family history of adverse reaction to anesthesia    Family history of colon cancer    Family history of pancreatic cancer    Family history of prostate cancer    Grover's disease     Past Surgical History:  Procedure Laterality Date   BREAST LUMPECTOMY WITH RADIOACTIVE SEED AND SENTINEL LYMPH NODE BIOPSY Left 02/10/2021   Procedure: LEFT BREAST LUMPECTOMY WITH RADIOACTIVE SEED AND SENTINEL LYMPH NODE BIOPSY;  Surgeon: Coralie Keens, MD;  Location: [facility];  Service: General;  Laterality: Left;   CESAREAN SECTION     x3    Vitals:   08/14/21 1619  Weight: 196 lb 6 oz (89.1 kg)     Subjective Assessment - 08/14/21 1618     Subjective Pt returns for her 3 month L-Dex screen. "Saturday I worked on [employer] all day and didn;t wear a bra all day. By the end of the day when I went to get in the shower I could tell my breast was really swollen and the skin around my nipple was even piulled taut. 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 She underwent a left  lumpcetomy and sentinel node biopsy (3 negative nodes) on 02/10/2021. It is ER/PR positive and HER2 negative with a Ki67 of 10%.                    L-DEX FLOWSHEETS - 08/14/21 1600       L-DEX LYMPHEDEMA SCREENING   Measurement Type Unilateral    L-DEX MEASUREMENT EXTREMITY Upper Extremity    POSITION  Standing    DOMINANT SIDE Right    At Risk Side Left    BASELINE SCORE (UNILATERAL) -0.6    L-DEX SCORE (UNILATERAL) 3.6    VALUE CHANGE (UNILAT) 4.2                                     PT Long Term Goals - 03/09/21 1053       PT LONG TERM GOAL #1   Title Patient will demonstrate she has regained full shoulder ROM and function post operatively compared to baselines.    Time 8    Period Weeks    Status Achieved                   Plan - 08/14/21 1642  Clinical Impression Statement Pt returns for her 3 month L-Dex screen. Her change from baseline of 4.2 is WNLs. Pt has c/o Lt breast swelling. 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 Request for referral sent to [facility] and pt scheduled evaluation and a few f/u visits. Also encouarged her to begin wearing her sports bra in the mean time and she verabalized understanding.    PT Next Visit Plan Eval Lt breast lymphedema. 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 F/U visits already scheduled. Cont every 3 month L-Dex screens for up to 2 years from her SLNB (~02/11/23)    Consulted and Agree with Plan of Care Patient             Patient will benefit from skilled therapeutic intervention in order to improve the following deficits and impairments:     Visit Diagnosis: Aftercare following surgery for neoplasm     Problem List Patient Active Problem List   Diagnosis Date Noted    Family history of prostate cancer    Family history of colon cancer    Family history of pancreatic cancer    Malignant neoplasm of upper-outer quadrant of left breast in female, estrogen receptor positive ([place]) 01/10/2021    Otelia Limes, PTA 08/14/2021, 4:45 PM  [facility] @ [address] [place], [place], [address] Phone: [phone]   Fax:  [phone]  Name: JESSEL GETTINGER MRN: [id number] Date of Birth: 07-08-1971

## 2021-08-15 ENCOUNTER — Other Ambulatory Visit: Payer: Self-pay | Admitting: Nurse Practitioner

## 2021-08-15 DIAGNOSIS — C50412 Malignant neoplasm of upper-outer quadrant of left female breast: Secondary | ICD-10-CM

## 2021-08-16 LAB — SURGICAL PATHOLOGY

## 2021-08-21 ENCOUNTER — Encounter: Payer: Self-pay | Admitting: Rehabilitation

## 2021-08-21 ENCOUNTER — Other Ambulatory Visit: Payer: Self-pay

## 2021-08-21 ENCOUNTER — Ambulatory Visit: Payer: BC Managed Care – PPO | Attending: Nurse Practitioner | Admitting: Rehabilitation

## 2021-08-21 DIAGNOSIS — R293 Abnormal posture: Secondary | ICD-10-CM

## 2021-08-21 DIAGNOSIS — Z483 Aftercare following surgery for neoplasm: Secondary | ICD-10-CM | POA: Diagnosis not present

## 2021-08-21 DIAGNOSIS — Z170 Estrogen receptor positive status [ER+]: Secondary | ICD-10-CM | POA: Diagnosis present

## 2021-08-21 DIAGNOSIS — M25612 Stiffness of left shoulder, not elsewhere classified: Secondary | ICD-10-CM | POA: Diagnosis present

## 2021-08-21 DIAGNOSIS — I890 Lymphedema, not elsewhere classified: Secondary | ICD-10-CM

## 2021-08-21 DIAGNOSIS — C50412 Malignant neoplasm of upper-outer quadrant of left female breast: Secondary | ICD-10-CM | POA: Insufficient documentation

## 2021-08-21 NOTE — Patient Instructions (Signed)
Access Code: [email or web address] URL: [email or web address]: 12/19/2022Prepared by: Marcene Brawn TevisExercises  Sidelying Thoracic Rotation with Open Book - 1 x daily - 7 x weekly - 1-3 sets - 10 reps - 20-30 seconds hold  Single Arm Doorway Pec Stretch at 90 Degrees Abduction - 1 x daily - 7 x weekly - 10 reps - 20-30 seconds hold  Prone Chest Stretch on Chair - 1 x daily - 7 x weekly - 1-3 sets - 10 reps - 20-30 seconds hold  Self manual lymph drainage: Perform this sequence once a day.  Only give enough pressure no your skin to make the skin move.  Diaphragmatic - Supine   Inhale through nose making navel move out toward hands. Exhale through puckered lips, hands follow navel in. Repeat _5__ times. Rest _10__ seconds between repeats.   Copyright  VHI. All rights reserved.  Hug yourself.  Do circles at your neck just above your collarbones.  Repeat this 10 times.  Axilla - One at a Time   Using full weight of flat hand and fingers at center of uninvolved armpit, make _10__ in-place circles.   Copyright  VHI. All rights reserved.  LEG: Inguinal Nodes Stimulation   With small finger side of hand against hip crease on involved side, gently perform circles at the crease. Repeat __10_ times.   Copyright  VHI. All rights reserved.  Axilla to Inguinal Nodes - Sweep   On involved side, sweep _4__ times from armpit along side of trunk to hip crease.  Now gently stretch skin from the involved side to the uninvolved side across the chest at the shoulder line.  Repeat that 4 times.  Draw an imaginary diagonal line from upper outer breast through the nipple area toward lower inner breast.  Direct fluid upward and inward from this line toward the pathway across your upper chest .  Do this in three rows to treat all of the upper inner breast tissue, and do each row 3-4x.      Direct fluid to treat all of lower outer breast tissue downward and outward toward      pathway that is aimed at  the left groin.  Finish by doing the pathways as described above going from your involved armpit to the same side groin and going across your upper chest from the involved shoulder to the uninvolved shoulder.  Repeat the steps above where you do circles in your left groin and right armpit. Copyright  VHI. All rights reserved.

## 2021-08-21 NOTE — Therapy (Signed)
[facility] @ [address] [place], [place], [address] Phone: [phone]   Fax:  [phone]  Physical Therapy Treatment  Patient Details  Name: Brandi Phelps MRN: [id number] Date of Birth: Jan 08, 1971 Referring Provider (PT): Dr. Coralie Keens   Encounter Date: 08/21/2021   PT End of Session - 08/21/21 1155     Visit Number 3    Number of Visits 6    Date for PT Re-Evaluation 10/02/21    PT Start Time 0804    PT Stop Time 4315    PT Time Calculation (min) 51 min    Activity Tolerance Patient tolerated treatment well    Behavior During Therapy [facility] for tasks assessed/performed             Past Medical History:  Diagnosis Date   Cancer ([place]) 01/2021   left breast IDC in situ   Complication of anesthesia    Family history of adverse reaction to anesthesia    Family history of colon cancer    Family history of pancreatic cancer    Family history of prostate cancer    Grover's disease     Past Surgical History:  Procedure Laterality Date   BREAST LUMPECTOMY WITH RADIOACTIVE SEED AND SENTINEL LYMPH NODE BIOPSY Left 02/10/2021   Procedure: LEFT BREAST LUMPECTOMY WITH RADIOACTIVE SEED AND SENTINEL LYMPH NODE BIOPSY;  Surgeon: Coralie Keens, MD;  Location: [facility];  Service: General;  Laterality: Left;   CESAREAN SECTION     x3    There were no vitals filed for this visit.   Subjective Assessment - 08/21/21 0805     Subjective It was really swollen after I cleaned my house without a bra.  I noticed some more tightness in the armpit with it. 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 She underwent a left lumpcetomy and sentinel node biopsy (3 negative nodes) on 02/10/2021. It is ER/PR positive and HER2 negative with a Ki67 of  10%. Radiation completed.  Taking tamoxifen    Currently in Pain? No/denies   tender in the axilla to touch               [facility] PT Assessment - 08/21/21 0001       Assessment   Medical Diagnosis s/p left lumpectomy and SLNB    Referring Provider (PT) Dr. Coralie Keens    Onset Date/Surgical Date 02/10/21    Hand Dominance Right    Prior Therapy Baselines      Precautions   Precautions Other (comment)    Precaution Comments left arm lymphedema risk      Restrictions   Weight Bearing Restrictions No      Balance Screen   Has the patient fallen in the past 6 months No    Has the patient had a decrease in activity level because of a fear of falling?  No    Is the patient reluctant to leave their home because of a fear of falling?  No      Home Environment   Living Environment Private residence    Living Arrangements Spouse/significant other;Children    Available Help at Discharge Family      Prior Function   Level of Independence Independent    Vocation Full time employment    Vocation Requirements K/1  grade teacher      Cognition   Overall Cognitive Status Within Functional Limits for tasks assessed      Observation/Other Assessments   Observations skin puckering near lumpectomy incision and more lateral puffiness left breast      ROM / Strength   AROM / PROM / Strength AROM      AROM   Left Shoulder Flexion 155 Degrees   lat pull   Left Shoulder ABduction 150 Degrees   pectoralis region pull   Left Shoulder External Rotation 100 Degrees      Palpation   Palpation comment fibrosis inferior lateral breast, scar tissue surrounding lump incision, tightness pectoralis and latissimus                           [facility] Adult PT Treatment/Exercise - 08/21/21 0001       Manual Therapy   Manual Therapy Manual Lymphatic Drainage (MLD);Myofascial release;Soft tissue mobilization;Passive ROM    Soft tissue mobilization left pectoralis, latissimus     Myofascial Release Lt ailla and scar tissue in axilla    Manual Lymphatic Drainage (MLD) started education for patient on left breast MLD: performed all steps and then pt repeated with hand over hand and vcs for feedback. clavicular nodes, 5 deep breaths, bil axillary nodes, Lt inguinal nodes, anterior interaxillary pathway, Lt axillo inguinal pathway then Lt breast towards pathways and then vcs on repeating steps.    Passive ROM to the Lt shoulder                     PT Education - 08/21/21 1155     Education Details breast MLD, new HEP, POC    Person(s) Educated Patient    Methods Explanation;Demonstration;Tactile cues;Verbal cues;Handout    Comprehension Verbalized understanding;Returned demonstration;Verbal cues required;Tactile cues required;Need further instruction                 PT Long Term Goals - 08/21/21 1201       PT LONG TERM GOAL #1   Title Patient will demonstrate she has regained full shoulder Abd AROM and function post operatively compared to baselines.    Time 6    Period Weeks    Status New      PT LONG TERM GOAL #2   Title Pt will be ind with self MLD for the Lt breast    Time 6    Period Weeks    Status New                   Plan - 08/21/21 1156     Clinical Impression Statement Pt returns for management of new lt breast edema and increased axillary tightness recently.  Pt is now 3-4 months post radiation which is an expected time for increased tightness and new onset of edema.  Education on radiation healing and need for ongoing stretches, gave pt script and second to nature handout, self MLD handout with brief instruction today and new set of exercises to start performing.  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    Personal Factors and Comorbidities Comorbidity 2    Comorbidities SLNB, radiation    Examination-Activity Limitations Reach Overhead     Stability/Clinical Decision Making Evolving/Moderate complexity    Clinical Decision Making Low    Rehab Potential Excellent    PT Frequency --  4 total visits   PT Duration 8 weeks    PT Treatment/Interventions ADLs/Self Care Home Management;Therapeutic exercise;Patient/family education;Manual techniques;Manual lymph drainage;Passive range of motion;Taping    PT Next Visit Plan how was self MLD? get bra?   perform stretches in clinic, review and perform Lt breast MLD along with MFR to the axilla and STM as needed. Cont every 3 month L-Dex screens for up to 2 years from her SLNB (~02/11/23)    PT Home Exercise Plan Access Code: [email or web address]  URL: [email or web address]  Date: 08/21/2021  Prepared by: Shan Levans    Exercises  Sidelying Thoracic Rotation with Open Book - 1 x daily - 7 x weekly - 1-3 sets - 10 reps - 20-30 seconds hold  Single Arm Doorway Pec Stretch at 90 Degrees Abduction - 1 x daily - 7 x weekly - 10 reps - 20-30 seconds hold  Prone Chest Stretch on Chair - 1 x daily - 7 x weekly - 1-3 sets - 10 reps - 20-30 seconds hold    Consulted and Agree with Plan of Care Patient             Patient will benefit from skilled therapeutic intervention in order to improve the following deficits and impairments:  Postural dysfunction, Decreased range of motion, Decreased knowledge of precautions, Impaired UE functional use, Pain, Increased edema  Visit Diagnosis: Aftercare following surgery for neoplasm  Malignant neoplasm of upper-outer quadrant of left breast in female, estrogen receptor positive ([facility])  Abnormal posture  Lymphedema, not elsewhere classified  Stiffness of left shoulder, not elsewhere classified     Problem List Patient Active Problem List   Diagnosis Date Noted   Family history of prostate cancer    Family history of colon cancer    Family history of pancreatic cancer    Malignant neoplasm of upper-outer quadrant of left breast in female, estrogen  receptor positive ([place]) 01/10/2021    Stark Bray, PT 08/21/2021, 12:02 PM  [facility] @ [address] [place], [place], [address] Phone: [phone]   Fax:  [phone]  Name: Brandi Phelps MRN: [id number] Date of Birth: 04/14/1971

## 2021-08-23 ENCOUNTER — Other Ambulatory Visit: Payer: Self-pay

## 2021-08-23 ENCOUNTER — Ambulatory Visit: Payer: BC Managed Care – PPO

## 2021-08-23 DIAGNOSIS — R293 Abnormal posture: Secondary | ICD-10-CM | POA: Diagnosis present

## 2021-08-23 DIAGNOSIS — I890 Lymphedema, not elsewhere classified: Secondary | ICD-10-CM | POA: Diagnosis present

## 2021-08-23 DIAGNOSIS — M25612 Stiffness of left shoulder, not elsewhere classified: Secondary | ICD-10-CM | POA: Diagnosis present

## 2021-08-23 DIAGNOSIS — C50412 Malignant neoplasm of upper-outer quadrant of left female breast: Secondary | ICD-10-CM | POA: Diagnosis present

## 2021-08-23 DIAGNOSIS — Z170 Estrogen receptor positive status [ER+]: Secondary | ICD-10-CM

## 2021-08-23 DIAGNOSIS — Z483 Aftercare following surgery for neoplasm: Secondary | ICD-10-CM | POA: Diagnosis not present

## 2021-08-23 NOTE — Therapy (Signed)
[facility] @ [place], [place], [address] Phone: [phone]   Fax:  [phone]  Physical Therapy Treatment  Patient Details  Name: Brandi Phelps MRN: [id number] Date of Birth: [birth year] Referring Provider (PT): Dr. Coralie Keens   Encounter Date: 08/23/2021   PT End of Session - 08/23/21 1654     Visit Number 4    Number of Visits 6    Date for PT Re-Evaluation 10/02/21    PT Start Time 1603    PT Stop Time 1656    PT Time Calculation (min) 53 min    Activity Tolerance Patient tolerated treatment well    Behavior During Therapy [facility] for tasks assessed/performed             Past Medical History:  Diagnosis Date   Cancer ([place]) 01/2021   left breast IDC in situ   Complication of anesthesia    Family history of adverse reaction to anesthesia    Family history of colon cancer    Family history of pancreatic cancer    Family history of prostate cancer    Grover's disease     Past Surgical History:  Procedure Laterality Date   BREAST LUMPECTOMY WITH RADIOACTIVE SEED AND SENTINEL LYMPH NODE BIOPSY Left 02/10/2021   Procedure: LEFT BREAST LUMPECTOMY WITH RADIOACTIVE SEED AND SENTINEL LYMPH NODE BIOPSY;  Surgeon: Coralie Keens, MD;  Location: [facility];  Service: General;  Laterality: Left;   CESAREAN SECTION     x3    There were no vitals filed for this visit.   Subjective Assessment - 08/23/21 1602     Subjective SWelling seems to be going down. I went to [facility] [place] [facility].  I got 2 compression bras. I really like it except for it rolls up on me. I have tried to do the MLD. 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 She underwent a left lumpcetomy and sentinel node biopsy (3 negative nodes) on 02/10/2021. It is ER/PR positive and HER2  negative with a Ki67 of 10%. Radiation completed.  Taking tamoxifen    Patient Stated Goals See if my arm has recovered    Currently in Pain? Yes    Pain Score 2     Pain Location Rib cage    Pain Orientation Left    Pain Descriptors / Indicators Tender    Pain Onset Yesterday    Pain Frequency Intermittent                               [facility] Adult PT Treatment/Exercise - 08/23/21 0001       Shoulder Exercises: Supine   Other Supine Exercises AROM flexion, scaption, and horizontal abd bilateral UEs x 5      Manual Therapy   Soft tissue mobilization left pectoralis, latissimus in supine and in SL lats,scapular region and UT    Myofascial Release Lt ailla and scar tissue in axilla    Manual Lymphatic Drainage (MLD) Continued education for patient on left breast MLD: performed all steps and then pt repeated with TC's and vcs for feedback. clavicular nodes, 5 deep breaths, bil axillary nodes, Lt inguinal nodes, anterior interaxillary pathway, Lt axillo inguinal pathway then Lt breast towards pathways and then vcs on repeating steps.  Passive ROM to the Lt shoulder flexion, abd, D2 flexion                    PT Education - 08/23/21 1653     Education Details left breast MLD    Person(s) Educated Patient    Methods Explanation;Handout;Demonstration    Comprehension Returned demonstration;Need further instruction                 PT Long Term Goals - 08/21/21 1201       PT LONG TERM GOAL #1   Title Patient will demonstrate she has regained full shoulder Abd AROM and function post operatively compared to baselines.    Time 6    Period Weeks    Status New      PT LONG TERM GOAL #2   Title Pt will be ind with self MLD for the Lt breast    Time 6    Period Weeks    Status New                   Plan - 08/23/21 1613     Clinical Impression Statement Performed soft tissue mobilization to pecs, lats, UT, and scapular area with  multiple tenderpoints noted,  Performed PROM to right shoulder and AROM for flexion, scaption and horizontal abduction so pt could feel the different stretch with each. Reviewed all MLD techniques to the left breast and read instructions to pt having her perform them.  She did very well overall, but required occasional VC's and TCs for proper stretch without sliding and to slow down.    Personal Factors and Comorbidities Comorbidity 2    Comorbidities SLNB, radiation    Examination-Activity Limitations Reach Overhead    Stability/Clinical Decision Making Evolving/Moderate complexity    Rehab Potential Excellent    PT Duration 8 weeks    PT Treatment/Interventions ADLs/Self Care Home Management;Therapeutic exercise;Patient/family education;Manual techniques;Manual lymph drainage;Passive range of motion;Taping    PT Next Visit Plan how was self MLD?  stretches in clinic, review and perform Lt breast MLD along with MFR to the axilla and STM as needed. Cont every 3 month L-Dex screens for up to 2 years from her SLNB (~02/11/23) May be able to DC or put on hold or schedule last visit in a few weeks next visit. Pt drives an hour to get here    PT Home Exercise Plan Access Code: [email or web address]  URL: [email or web address]  Date: 08/21/2021  Prepared by: Shan Levans    Exercises  Sidelying Thoracic Rotation with Open Book - 1 x daily - 7 x weekly - 1-3 sets - 10 reps - 20-30 seconds hold  Single Arm Doorway Pec Stretch at 90 Degrees Abduction - 1 x daily - 7 x weekly - 10 reps - 20-30 seconds hold  Prone Chest Stretch on Chair - 1 x daily - 7 x weekly - 1-3 sets - 10 reps - 20-30 seconds hold    Recommended Other Services got compression bra (    Consulted and Agree with Plan of Care Patient             Patient will benefit from skilled therapeutic intervention in order to improve the following deficits and impairments:  Postural dysfunction, Decreased range of motion, Decreased knowledge of  precautions, Impaired UE functional use, Pain, Increased edema  Visit Diagnosis: Aftercare following surgery for neoplasm  Malignant neoplasm of upper-outer quadrant of left breast in female, estrogen receptor positive (HCC)  Abnormal  posture  Lymphedema, not elsewhere classified  Stiffness of left shoulder, not elsewhere classified     Problem List Patient Active Problem List   Diagnosis Date Noted   Family history of prostate cancer    Family history of colon cancer    Family history of pancreatic cancer    Malignant neoplasm of upper-outer quadrant of left breast in female, estrogen receptor positive ([place]) 01/10/2021    Claris Pong, PT 08/23/2021, 5:04 PM  [facility] @ [place], [place], [address] Phone: [phone]   Fax:  [phone]  Name: ANALEI WHINERY MRN: [id number] Date of Birth: 09/07/1970

## 2021-08-23 NOTE — Patient Instructions (Signed)
Pt was given written and illustrated directions for left breast MLD

## 2021-08-25 ENCOUNTER — Encounter: Payer: BC Managed Care – PPO | Admitting: Rehabilitation

## 2021-08-30 ENCOUNTER — Ambulatory Visit: Payer: BC Managed Care – PPO

## 2021-08-30 ENCOUNTER — Other Ambulatory Visit: Payer: Self-pay

## 2021-08-30 DIAGNOSIS — Z483 Aftercare following surgery for neoplasm: Secondary | ICD-10-CM | POA: Diagnosis not present

## 2021-08-30 DIAGNOSIS — M25612 Stiffness of left shoulder, not elsewhere classified: Secondary | ICD-10-CM

## 2021-08-30 DIAGNOSIS — R293 Abnormal posture: Secondary | ICD-10-CM

## 2021-08-30 DIAGNOSIS — I890 Lymphedema, not elsewhere classified: Secondary | ICD-10-CM

## 2021-08-30 DIAGNOSIS — Z170 Estrogen receptor positive status [ER+]: Secondary | ICD-10-CM

## 2021-08-30 NOTE — Therapy (Signed)
[facility] @ [place], [place], [address] Phone: [phone]   Fax:  [phone]  Physical Therapy Treatment  Patient Details  Name: Brandi Phelps MRN: [id number] Date of Birth: Jun 03, 1971 Referring Provider (PT): Dr. Coralie Keens   Encounter Date: 08/30/2021   PT End of Session - 08/30/21 1728     Visit Number 5    Number of Visits 6    Date for PT Re-Evaluation 10/02/21    PT Start Time 4944    PT Stop Time 1550    PT Time Calculation (min) 54 min             Past Medical History:  Diagnosis Date   Cancer ([place]) 01/2021   left breast IDC in situ   Complication of anesthesia    Family history of adverse reaction to anesthesia    Family history of colon cancer    Family history of pancreatic cancer    Family history of prostate cancer    Grover's disease     Past Surgical History:  Procedure Laterality Date   BREAST LUMPECTOMY WITH RADIOACTIVE SEED AND SENTINEL LYMPH NODE BIOPSY Left 02/10/2021   Procedure: LEFT BREAST LUMPECTOMY WITH RADIOACTIVE SEED AND SENTINEL LYMPH NODE BIOPSY;  Surgeon: Coralie Keens, MD;  Location: [facility];  Service: General;  Laterality: Left;   CESAREAN SECTION     x3    There were no vitals filed for this visit.   Subjective Assessment - 08/30/21 1450     Subjective I wore a regular bra on Christmas and I could tell I was swelling at the side of my left breast. I can tell I swell more at night when I don't have compression on. I get the MLD in about twice a day.    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 She underwent a left lumpcetomy and sentinel node biopsy (3 negative nodes) on 02/10/2021. It is ER/PR positive and HER2 negative with a Ki67 of 10%. Radiation completed.  Taking tamoxifen    Patient Stated Goals See if my arm has recovered    Currently in Pain?  No/denies    Pain Score 0-No pain    Multiple Pain Sites No                [facility] PT Assessment - 08/30/21 0001       AROM   Left Shoulder Flexion 154 Degrees    Left Shoulder ABduction 167 Degrees    Left Shoulder External Rotation 107 Degrees                           [facility] Adult PT Treatment/Exercise - 08/30/21 0001       Shoulder Exercises: Supine   Other Supine Exercises supine wand scaption x 5      Shoulder Exercises: Standing   External Rotation Strengthening;Both;10 reps    Theraband Level (Shoulder External Rotation) Level 1 (Yellow)    Extension Strengthening;Both;10 reps    Theraband Level (Shoulder Extension) Level 1 (Yellow)    Retraction Strengthening;Both;10 reps    Theraband Level (Shoulder Retraction) Level 1 (Yellow)      Manual Therapy   Soft tissue mobilization left pectoralis, latissimus in supine and in SL lats,scapular region and UT    Myofascial Release Lt axilla and scar tissue in axilla    Manual Lymphatic Drainage (MLD)  Continued education for patient on left breast MLD: PT read instructions and then pt repeated. clavicular nodes, 5 deep breaths, bil axillary nodes, Lt inguinal nodes, anterior interaxillary pathway, Lt axillo inguinal pathway then Lt breast towards pathways , and ended with LN's.    Passive ROM to the Lt shoulder flexion, scaption, abd, ER                     PT Education - 08/30/21 1718     Education Details Scap retraction, ext, bilateral ER with yellow, 3 Days/week, 10 reps to start    Person(s) Educated Patient    Methods Explanation    Comprehension Returned demonstration                 PT Long Term Goals - 08/30/21 1543       PT LONG TERM GOAL #1   Title Patient will demonstrate she has regained full shoulder Abd AROM and function post operatively compared to baselines.    Time 6    Period Weeks    Status Achieved    Target Date 08/30/21      PT LONG TERM GOAL #2   Title  Pt will be ind with self MLD for the Lt breast    Time 6    Status Achieved    Target Date 08/30/21                   Plan - 08/30/21 1722     Clinical Impression Statement Continued soft tissue mobilization to left upper quarter with continued, but improved tenderness. continued PROM of left shoulder with improved ROM noted and reviewed all self MLD techniques.  Pt did an exceptional job with MLD today and only required occasional VC's/TC's initially and then corrected.  Used good stretch and has excellent understanding of sequence. Pt has achieved goals. she will return in a couple of weeks to review and DC.    Personal Factors and Comorbidities Comorbidity 2    Comorbidities SLNB, radiation    Examination-Activity Limitations Reach Overhead    Stability/Clinical Decision Making Evolving/Moderate complexity    Rehab Potential Excellent    PT Duration 8 weeks    PT Treatment/Interventions ADLs/Self Care Home Management;Therapeutic exercise;Patient/family education;Manual techniques;Manual lymph drainage;Passive range of motion;Taping    PT Next Visit Plan Review MLD, Review TBand exs, reassess ROM Should be ready for DC, may consider a compression cami for night time    Consulted and Agree with Plan of Care Patient             Patient will benefit from skilled therapeutic intervention in order to improve the following deficits and impairments:  Postural dysfunction, Decreased range of motion, Decreased knowledge of precautions, Impaired UE functional use, Pain, Increased edema  Visit Diagnosis: Aftercare following surgery for neoplasm  Malignant neoplasm of upper-outer quadrant of left breast in female, estrogen receptor positive (HCC)  Abnormal posture  Lymphedema, not elsewhere classified  Stiffness of left shoulder, not elsewhere classified     Problem List Patient Active Problem List   Diagnosis Date Noted   Family history of prostate cancer    Family  history of colon cancer    Family history of pancreatic cancer    Malignant neoplasm of upper-outer quadrant of left breast in female, estrogen receptor positive ([place]) 01/10/2021    Claris Pong, PT 08/30/2021, 5:32 PM  [facility] @ [place], [place],  [address] Phone: [phone]   Fax:  [phone]  Name: Brandi Phelps MRN: [id number] Date of Birth: [birth year]

## 2021-08-30 NOTE — Patient Instructions (Signed)
Access Code: [email or web address] URL: [email or web address] Date: 08/30/2021 Prepared by: Cheral Almas  Exercises Scapular Retraction with Resistance - 1 x daily - 3 x weekly - 10 reps Scapular Retraction with Resistance Advanced - 1 x daily - 3 x weekly - 1 sets - 10 reps Shoulder External Rotation and Scapular Retraction with Resistance - 1 x daily - 3 x weekly - 1 sets - 10 reps

## 2021-09-18 ENCOUNTER — Encounter: Payer: BC Managed Care – PPO | Admitting: Rehabilitation

## 2021-10-12 ENCOUNTER — Other Ambulatory Visit: Payer: Self-pay

## 2021-10-12 DIAGNOSIS — Z170 Estrogen receptor positive status [ER+]: Secondary | ICD-10-CM

## 2021-10-12 DIAGNOSIS — C50412 Malignant neoplasm of upper-outer quadrant of left female breast: Secondary | ICD-10-CM

## 2021-10-13 ENCOUNTER — Inpatient Hospital Stay: Payer: BC Managed Care – PPO | Attending: Hematology

## 2021-10-13 ENCOUNTER — Inpatient Hospital Stay: Payer: BC Managed Care – PPO | Admitting: Hematology

## 2021-10-13 ENCOUNTER — Encounter: Payer: Self-pay | Admitting: Hematology

## 2021-10-13 ENCOUNTER — Other Ambulatory Visit: Payer: Self-pay

## 2021-10-13 ENCOUNTER — Inpatient Hospital Stay: Payer: BC Managed Care – PPO

## 2021-10-13 VITALS — BP 98/67 | HR 77 | Temp 98.2°F | Resp 17 | Wt 193.4 lb

## 2021-10-13 DIAGNOSIS — R232 Flushing: Secondary | ICD-10-CM | POA: Diagnosis not present

## 2021-10-13 DIAGNOSIS — Z800 Family history of malignant neoplasm of digestive organs: Secondary | ICD-10-CM | POA: Insufficient documentation

## 2021-10-13 DIAGNOSIS — Z8042 Family history of malignant neoplasm of prostate: Secondary | ICD-10-CM | POA: Diagnosis not present

## 2021-10-13 DIAGNOSIS — C50412 Malignant neoplasm of upper-outer quadrant of left female breast: Secondary | ICD-10-CM | POA: Insufficient documentation

## 2021-10-13 DIAGNOSIS — Z170 Estrogen receptor positive status [ER+]: Secondary | ICD-10-CM | POA: Insufficient documentation

## 2021-10-13 DIAGNOSIS — Z79899 Other long term (current) drug therapy: Secondary | ICD-10-CM | POA: Insufficient documentation

## 2021-10-13 LAB — CBC WITH DIFFERENTIAL (CANCER CENTER ONLY)
Abs Immature Granulocytes: 0.01 10*3/uL (ref 0.00–0.07)
Basophils Absolute: 0 10*3/uL (ref 0.0–0.1)
Basophils Relative: 1 %
Eosinophils Absolute: 0.2 10*3/uL (ref 0.0–0.5)
Eosinophils Relative: 5 %
HCT: 37.8 % (ref 36.0–46.0)
Hemoglobin: 12.5 g/dL (ref 12.0–15.0)
Immature Granulocytes: 0 %
Lymphocytes Relative: 13 %
Lymphs Abs: 0.6 10*3/uL — ABNORMAL LOW (ref 0.7–4.0)
MCH: 30.3 pg (ref 26.0–34.0)
MCHC: 33.1 g/dL (ref 30.0–36.0)
MCV: 91.7 fL (ref 80.0–100.0)
Monocytes Absolute: 0.3 10*3/uL (ref 0.1–1.0)
Monocytes Relative: 7 %
Neutro Abs: 3.7 10*3/uL (ref 1.7–7.7)
Neutrophils Relative %: 74 %
Platelet Count: 190 10*3/uL (ref 150–400)
RBC: 4.12 MIL/uL (ref 3.87–5.11)
RDW: 12.4 % (ref 11.5–15.5)
WBC Count: 4.9 10*3/uL (ref 4.0–10.5)
nRBC: 0 % (ref 0.0–0.2)

## 2021-10-13 LAB — CMP (CANCER CENTER ONLY)
ALT: 10 U/L (ref 0–44)
AST: 14 U/L — ABNORMAL LOW (ref 15–41)
Albumin: 4.2 g/dL (ref 3.5–5.0)
Alkaline Phosphatase: 49 U/L (ref 38–126)
Anion gap: 5 (ref 5–15)
BUN: 16 mg/dL (ref 6–20)
CO2: 30 mmol/L (ref 22–32)
Calcium: 9.2 mg/dL (ref 8.9–10.3)
Chloride: 106 mmol/L (ref 98–111)
Creatinine: 0.68 mg/dL (ref 0.44–1.00)
GFR, Estimated: >60 mL/min (ref >60)
Glucose, Bld: 81 mg/dL (ref 70–99)
Potassium: 4.3 mmol/L (ref 3.5–5.1)
Sodium: 141 mmol/L (ref 135–145)
Total Bilirubin: 0.4 mg/dL (ref 0.3–1.2)
Total Protein: 6.7 g/dL (ref 6.5–8.1)

## 2021-10-13 NOTE — Progress Notes (Signed)
[facility]   [phone] [phone]   Clinic Follow up Note   Patient Care Team: [name], [place] as PCP - General (Family Medicine) Mauro Kaufmann, RN as Oncology Nurse Navigator Rockwell Germany, RN as Oncology Nurse Navigator Coralie Keens, MD as Consulting Physician (General Surgery) Truitt Merle, MD as Consulting Physician (Hematology) Kyung Rudd, MD as Consulting Physician (Radiation Oncology) Alla Feeling, NP as Nurse Practitioner (Nurse Practitioner)  Date of Service:  10/13/2021  CHIEF COMPLAINT: f/u of left breast cancer  CURRENT THERAPY:  Tamoxifen, started 05/2021  ASSESSMENT & PLAN:  Brandi Phelps is a 51 y.o. female with   1. Malignant neoplasm of upper-outer quadrant of left breast, Stage IA, pT1bN0M0, stage IA, ER+/PR+/HER2-, Grade I/II  -She was found to have a 0.8cm left breast mass with biopsy confirmed invasive ductal carcinoma and components of DCIS.  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  -Oncotype DX recurrence score of 6 (low risk) -she received radiation 7/25-8/22/22 under Dr. Lisbeth Renshaw. -she began tamoxifen in 05/2021. She is tolerating well overall with some hot flashes. -she is clinically doing well. Labs reviewed, no concern. Physical exam was unremarkable. There is no clinical concern for recurrence. -she will be due for mammogram in late 12/2021.   2. Genetics -she met with our genetic counselor on 01/11/21 and declined to pursue testing at that time.     PLAN:  -continue tamoxifen -mammogram late 12/2021 -lab and f/u with NP in 6 months   No problem-specific Assessment & Plan notes found for this encounter.   SUMMARY OF ONCOLOGIC HISTORY: Oncology History Overview Note  Cancer Staging Malignant neoplasm of upper-outer quadrant of left breast in female, estrogen receptor positive  ([place]) Staging form: Breast, AJCC 8th Edition - Clinical stage from 01/11/2021: Stage IA (cT1b, cN0, cM0, G2, ER+, PR+, HER2-) - Signed by Truitt Merle, MD on 01/11/2021 Stage prefix: Initial diagnosis Histologic grading system: 3 grade system Laterality: Left Staged by: Pathologist and managing physician Stage used in treatment planning: Yes National guidelines used in treatment planning: Yes Type of national guideline used in treatment planning: NCCN - Pathologic stage from 02/10/2021: Stage IA (pT1b, pN0, cM0, G2, ER+, PR+, HER2-) - Signed by Truitt Merle, MD on 04/12/2021 Stage prefix: Initial diagnosis Histologic grading system: 3 grade system    Malignant neoplasm of upper-outer quadrant of left breast in female, estrogen receptor positive ([place])  01/05/2021 Initial Biopsy   Diagnosis Breast, left, needle core biopsy, upper outer - INVASIVE DUCTAL CARCINOMA, GRADE 1/2. - DUCTAL CARCINOMA IN SITU. Microscopic Comment The greatest tumor dimension is 0.8 cm. A breast prognostic profile will be performed. Dr. Tresa Moore agrees.   01/05/2021 Receptors her2   PROGNOSTIC INDICATORS Results: IMMUNOHISTOCHEMICAL AND MORPHOMETRIC ANALYSIS PERFORMED MANUALLY The tumor cells are NEGATIVE for Her2 (1+). Estrogen Receptor: 85%, POSITIVE, MODERATE STAINING INTENSITY Progesterone Receptor: 95%, POSITIVE, STRONG STAINING INTENSITY Proliferation Marker Ki67: 10%   01/05/2021 Mammogram   Mammogram  Left breast mass 1:30 position   01/10/2021 Initial Diagnosis   Malignant neoplasm of upper-outer quadrant of left breast in female, estrogen receptor positive ([place])   01/11/2021 Cancer Staging   Staging form: Breast, AJCC 8th Edition - Clinical stage from 01/11/2021: Stage IA (cT1b, cN0, cM0, G2, ER+, PR+, HER2-) - Signed by Truitt Merle, MD on 01/11/2021 Stage prefix: Initial diagnosis Histologic grading system: 3 grade system Laterality: Left Staged by:  Pathologist and managing physician Stage used in treatment  planning: Yes National guidelines used in treatment planning: Yes Type of national guideline used in treatment planning: NCCN    02/10/2021 Cancer Staging   Staging form: Breast, AJCC 8th Edition - Pathologic stage from 02/10/2021: Stage IA (pT1b, pN0, cM0, G2, ER+, PR+, HER2-) - Signed by Truitt Merle, MD on 04/12/2021 Stage prefix: Initial diagnosis Histologic grading system: 3 grade system    02/10/2021 Pathology Results   FINAL MICROSCOPIC DIAGNOSIS:   A. 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 BREAST, LEFT NEW MEDIAL/INFERIOR MARGIN, EXCISION:  -  No residual carcinoma identified   C. LYMPH NODE, LEFT AXILLARY, SENTINEL, EXCISION:  -  No carcinoma identified in one lymph node (0/1)   D. LYMPH NODE, LEFT AXILLARY, SENTINEL, EXCISION:  -  No carcinoma identified in one lymph node (0/1)   E. LYMPH NODE, LEFT AXILLARY, SENTINEL, EXCISION:  -  No carcinoma identified in one lymph node (0/1)    02/10/2021 Oncotype testing   Recurrence score of 6, risk of distant metastasis 3%, no benefit from chemo   03/27/2021 - 04/24/2021 Radiation Therapy   Under Dr. Lisbeth Renshaw      INTERVAL HISTORY:  Brandi Phelps is here for a follow up of breast cancer. She was last seen by me on 04/13/21 with survivorship visit in the interim. She presents to the clinic alone. She reports she is doing well overall with some mild hot flashes from tamoxifen. 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   All other systems were reviewed with the patient and are negative.  MEDICAL HISTORY:  Past Medical History:  Diagnosis Date   Cancer ([place]) 01/2021   left breast IDC in situ   Complication of anesthesia    Family history of adverse reaction to anesthesia    Family  history of colon cancer    Family history of pancreatic cancer    Family history of prostate cancer    Grover's disease     SURGICAL HISTORY: Past Surgical History:  Procedure Laterality Date   BREAST LUMPECTOMY WITH RADIOACTIVE SEED AND SENTINEL LYMPH NODE BIOPSY Left 02/10/2021   Procedure: LEFT BREAST LUMPECTOMY WITH RADIOACTIVE SEED AND SENTINEL LYMPH NODE BIOPSY;  Surgeon: Coralie Keens, MD;  Location: [facility];  Service: General;  Laterality: Left;   CESAREAN SECTION     x3    I have reviewed the social history and family history with the patient and they are unchanged from previous note.  ALLERGIES:  is allergic to methotrexate derivatives.  MEDICATIONS:  Current Outpatient Medications  Medication Sig Dispense Refill   ibuprofen (ADVIL) 200 MG tablet Take 200 mg by mouth every 6 (six) hours as needed.     Multiple Vitamins-Minerals (AIRBORNE GUMMIES PO) Take by mouth.     tamoxifen (NOLVADEX) 20 MG tablet Take 1 tablet (20 mg total) by mouth daily. 90 tablet 3   No current facility-administered medications for this visit.    PHYSICAL EXAMINATION: ECOG PERFORMANCE STATUS: 0 - Asymptomatic  Vitals:   10/13/21 1016  BP: 98/67  Pulse: 77  Resp: 17  Temp: 98.2 F (36.8 C)  SpO2: 100%   Wt Readings from Last 3 Encounters:  10/13/21  193 lb 7 oz (87.7 kg)  08/14/21 196 lb 6 oz (89.1 kg)  08/14/21 198 lb (89.8 kg)     GENERAL:alert, no distress and comfortable SKIN: skin color, texture, turgor are normal, no rashes or significant lesions EYES: normal, Conjunctiva are pink and non-injected, sclera clear  NECK: supple, thyroid normal size, non-tender, without nodularity LYMPH:  no palpable lymphadenopathy in the cervical, axillary  LUNGS: clear to auscultation and percussion with normal breathing effort HEART: regular rate & rhythm and no murmurs and no lower extremity edema ABDOMEN:abdomen soft, non-tender and normal bowel  sounds Musculoskeletal:no cyanosis of digits and no clubbing  NEURO: alert & oriented x 3 with fluent speech, no focal motor/sensory deficits BREAST: No palpable mass, nodules or adenopathy bilaterally. Breast exam benign.   LABORATORY DATA:  I have reviewed the data as listed CBC Latest Ref Rng & Units 10/13/2021 01/11/2021  WBC 4.0 - 10.5 K/uL 4.9 5.0  Hemoglobin 12.0 - 15.0 g/dL 12.5 13.4  Hematocrit 36.0 - 46.0 % 37.8 41.0  Platelets 150 - 400 K/uL 190 268     CMP Latest Ref Rng & Units 10/13/2021 01/11/2021  Glucose 70 - 99 mg/dL 81 122(H)  BUN 6 - 20 mg/dL 16 10  Creatinine 0.44 - 1.00 mg/dL 0.68 0.72  Sodium 135 - 145 mmol/L 141 141  Potassium 3.5 - 5.1 mmol/L 4.3 3.4(L)  Chloride 98 - 111 mmol/L 106 103  CO2 22 - 32 mmol/L 30 27  Calcium 8.9 - 10.3 mg/dL 9.2 9.5  Total Protein 6.5 - 8.1 g/dL 6.7 7.7  Total Bilirubin 0.3 - 1.2 mg/dL 0.4 0.7  Alkaline Phos 38 - 126 U/L 49 66  AST 15 - 41 U/L 14(L) 14(L)  ALT 0 - 44 U/L 10 9      RADIOGRAPHIC STUDIES: I have personally reviewed the radiological images as listed and agreed with the findings in the report. No results found.    No orders of the defined types were placed in this encounter.  All questions were answered. The patient knows to call the clinic with any problems, questions or concerns. No barriers to learning was detected. The total time spent in the appointment was 25 minutes.     Truitt Merle, MD 10/13/2021   I, Wilburn Mylar, am acting as scribe for Truitt Merle, MD.   I have reviewed the above documentation for accuracy and completeness, and I agree with the above.

## 2021-11-13 ENCOUNTER — Other Ambulatory Visit: Payer: Self-pay

## 2021-11-13 ENCOUNTER — Ambulatory Visit: Payer: BC Managed Care – PPO | Attending: Surgery

## 2021-11-13 VITALS — Wt 182.5 lb

## 2021-11-13 DIAGNOSIS — Z483 Aftercare following surgery for neoplasm: Secondary | ICD-10-CM

## 2021-11-13 NOTE — Therapy (Signed)
[place] [facility] [place] ?[place], [place], [address] ?Phone: [phone]   Fax:  [phone] ? ?Physical Therapy Treatment ? ?Patient Details  ?Name: Brandi Phelps ?MRN: [id number] ?Date of Birth: [birth year] ?Referring Provider (PT): Dr. Coralie Keens ? ? ?Encounter Date: 11/13/2021 ? ? PT End of Session - 11/13/21 0840   ? ? Visit Number 5   # unchanged due to screen only  ? PT Start Time [phone]   ? PT Stop Time 8502   ? PT Time Calculation (min) 10 min   ? Activity Tolerance Patient tolerated treatment well   ? Behavior During Therapy [facility] for tasks assessed/performed   ? ?  ?  ? ?  ? ? ?Past Medical History:  ?Diagnosis Date  ? Cancer ([facility]) 01/2021  ? left breast IDC in situ  ? Complication of anesthesia   ? Family history of adverse reaction to anesthesia   ? Family history of colon cancer   ? Family history of pancreatic cancer   ? Family history of prostate cancer   ? Grover's disease   ? ? ?Past Surgical History:  ?Procedure Laterality Date  ? BREAST LUMPECTOMY WITH RADIOACTIVE SEED AND SENTINEL LYMPH NODE BIOPSY Left 02/10/2021  ? Procedure: LEFT BREAST LUMPECTOMY WITH RADIOACTIVE SEED AND SENTINEL LYMPH NODE BIOPSY;  Surgeon: Coralie Keens, MD;  Location: [facility];  Service: General;  Laterality: Left;  ? CESAREAN SECTION    ? x3  ? ? ?Vitals:  ? 11/13/21 0839  ?Weight: 182 lb 8 oz (82.8 kg)  ? ? ? Subjective Assessment - 11/13/21 0839   ? ? Subjective Pt returns for hre 3 month L-Dex screen.   ? 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 She underwent a left lumpcetomy and sentinel node biopsy (3 negative nodes) on 02/10/2021. It is ER/PR positive and HER2 negative with a Ki67 of 10%. Radiation completed.  Taking tamoxifen   ? ?  ?  ? ?  ? ? ? ? ? ? ? ? ? L-DEX FLOWSHEETS - 11/13/21 0800   ? ?  ? L-DEX LYMPHEDEMA SCREENING  ? Measurement Type Unilateral   ?  L-DEX MEASUREMENT EXTREMITY Upper Extremity   ? POSITION  Standing   ? DOMINANT SIDE Right   ? At Risk Side Left   ? BASELINE SCORE (UNILATERAL) -0.6   ? L-DEX SCORE (UNILATERAL) 1.6   ? VALUE CHANGE (UNILAT) 2.2   ? ?  ?  ? ?  ? ? ? ? ? ? ? ? ? ? ? ? ? ? ? ? ? ? ? ? ? ? ? ? ? ? PT Long Term Goals - 08/30/21 1543   ? ?  ? PT LONG TERM GOAL #1  ? Title Patient will demonstrate she has regained full shoulder Abd AROM and function post operatively compared to baselines.   ? Time 6   ? Period Weeks   ? Status Achieved   ? Target Date 08/30/21   ?  ? PT LONG TERM GOAL #2  ? Title Pt will be ind with self MLD for the Lt breast   ? Time 6   ? Status Achieved   ? Target Date 08/30/21   ? ?  ?  ? ?  ? ? ? ? ? ? ? ? Plan - 11/13/21 0840   ? ? Clinical Impression Statement Pt returns for her 3  month L-Dex screen. Her change from baseline of is WNLs so no further treatment is required at this time to cont every 3 month L-Dex screens which pt is agreeable to.   ? PT Next Visit Plan Cont every 3 month L-Dex screens for up to 2 years from her SLBN (~02/11/2023)   ? Consulted and Agree with Plan of Care Patient   ? ?  ?  ? ?  ? ? ?Patient will benefit from skilled therapeutic intervention in order to improve the following deficits and impairments:    ? ?Visit Diagnosis: ?Aftercare following surgery for neoplasm ? ? ? ? ?Problem List ?Patient Active Problem List  ? Diagnosis Date Noted  ? Family history of prostate cancer   ? Family history of colon cancer   ? Family history of pancreatic cancer   ? Malignant neoplasm of upper-outer quadrant of left breast in female, estrogen receptor positive ([place]) 01/10/2021  ? ? ?Otelia Limes, PTA ?11/13/2021, 8:48 AM ? ?[facility] [place] ?[place], [place], [address] ?Phone: [phone]   Fax:  [phone] ? ?Name: Brandi Phelps ?MRN: [id number] ?Date of Birth: 07-13-1971 ? ? ? ?

## 2021-12-12 ENCOUNTER — Encounter (HOSPITAL_COMMUNITY): Payer: Self-pay

## 2022-01-01 ENCOUNTER — Ambulatory Visit
Admission: RE | Admit: 2022-01-01 | Discharge: 2022-01-01 | Disposition: A | Discharge status: Home / Self Care | Payer: BC Managed Care – PPO | Source: Ambulatory Visit | Attending: Nurse Practitioner | Admitting: Nurse Practitioner

## 2022-01-01 DIAGNOSIS — Z170 Estrogen receptor positive status [ER+]: Secondary | ICD-10-CM

## 2022-02-26 ENCOUNTER — Ambulatory Visit: Payer: BC Managed Care – PPO | Attending: Surgery

## 2022-02-26 VITALS — Wt 167.0 lb

## 2022-02-26 DIAGNOSIS — Z483 Aftercare following surgery for neoplasm: Secondary | ICD-10-CM | POA: Insufficient documentation

## 2022-04-12 ENCOUNTER — Inpatient Hospital Stay: Payer: BC Managed Care – PPO

## 2022-04-12 ENCOUNTER — Inpatient Hospital Stay: Payer: BC Managed Care – PPO | Attending: Adult Health | Admitting: Adult Health

## 2022-04-12 ENCOUNTER — Encounter: Payer: Self-pay | Admitting: Adult Health

## 2022-04-12 ENCOUNTER — Other Ambulatory Visit: Payer: Self-pay

## 2022-04-12 VITALS — BP 109/74 | HR 70 | Temp 98.1°F | Wt 167.5 lb

## 2022-04-12 DIAGNOSIS — Z87891 Personal history of nicotine dependence: Secondary | ICD-10-CM | POA: Insufficient documentation

## 2022-04-12 DIAGNOSIS — Z170 Estrogen receptor positive status [ER+]: Secondary | ICD-10-CM | POA: Insufficient documentation

## 2022-04-12 DIAGNOSIS — Z800 Family history of malignant neoplasm of digestive organs: Secondary | ICD-10-CM | POA: Insufficient documentation

## 2022-04-12 DIAGNOSIS — Z79899 Other long term (current) drug therapy: Secondary | ICD-10-CM | POA: Diagnosis not present

## 2022-04-12 DIAGNOSIS — Z79810 Long term (current) use of selective estrogen receptor modulators (SERMs): Secondary | ICD-10-CM | POA: Insufficient documentation

## 2022-04-12 DIAGNOSIS — R232 Flushing: Secondary | ICD-10-CM | POA: Insufficient documentation

## 2022-04-12 DIAGNOSIS — C50412 Malignant neoplasm of upper-outer quadrant of left female breast: Secondary | ICD-10-CM | POA: Diagnosis present

## 2022-04-12 DIAGNOSIS — Z8042 Family history of malignant neoplasm of prostate: Secondary | ICD-10-CM | POA: Diagnosis not present

## 2022-04-12 LAB — CBC WITH DIFFERENTIAL (CANCER CENTER ONLY)
Abs Immature Granulocytes: 0.01 10*3/uL (ref 0.00–0.07)
Basophils Absolute: 0.1 10*3/uL (ref 0.0–0.1)
Basophils Relative: 2 %
Eosinophils Absolute: 0.5 10*3/uL (ref 0.0–0.5)
Eosinophils Relative: 11 %
HCT: 37.8 % (ref 36.0–46.0)
Hemoglobin: 12.8 g/dL (ref 12.0–15.0)
Immature Granulocytes: 0 %
Lymphocytes Relative: 25 %
Lymphs Abs: 1.1 10*3/uL (ref 0.7–4.0)
MCH: 30.5 pg (ref 26.0–34.0)
MCHC: 33.9 g/dL (ref 30.0–36.0)
MCV: 90 fL (ref 80.0–100.0)
Monocytes Absolute: 0.3 10*3/uL (ref 0.1–1.0)
Monocytes Relative: 7 %
Neutro Abs: 2.4 10*3/uL (ref 1.7–7.7)
Neutrophils Relative %: 55 %
Platelet Count: 215 10*3/uL (ref 150–400)
RBC: 4.2 MIL/uL (ref 3.87–5.11)
RDW: 12.2 % (ref 11.5–15.5)
WBC Count: 4.4 10*3/uL (ref 4.0–10.5)
nRBC: 0 % (ref 0.0–0.2)

## 2022-04-12 LAB — CMP (CANCER CENTER ONLY)
ALT: 9 U/L (ref 0–44)
AST: 12 U/L — ABNORMAL LOW (ref 15–41)
Albumin: 4.3 g/dL (ref 3.5–5.0)
Alkaline Phosphatase: 54 U/L (ref 38–126)
Anion gap: 6 (ref 5–15)
BUN: 12 mg/dL (ref 6–20)
CO2: 29 mmol/L (ref 22–32)
Calcium: 9.2 mg/dL (ref 8.9–10.3)
Chloride: 105 mmol/L (ref 98–111)
Creatinine: 0.6 mg/dL (ref 0.44–1.00)
GFR, Estimated: >60 mL/min (ref >60)
Glucose, Bld: 92 mg/dL (ref 70–99)
Potassium: 3.9 mmol/L (ref 3.5–5.1)
Sodium: 140 mmol/L (ref 135–145)
Total Bilirubin: 0.4 mg/dL (ref 0.3–1.2)
Total Protein: 7.2 g/dL (ref 6.5–8.1)

## 2022-04-12 NOTE — Progress Notes (Signed)
[facility] Follow up:    Brandi Phelps, [address] [place] [address]   DIAGNOSIS:  Cancer Staging  Malignant neoplasm of upper-outer quadrant of left breast in female, estrogen receptor positive ([place]) Staging form: Breast, AJCC 8th Edition - Clinical stage from 01/11/2021: Stage IA (cT1b, cN0, cM0, G2, ER+, PR+, HER2-) - Signed by Truitt Merle, MD on 01/11/2021 Stage prefix: Initial diagnosis Histologic grading system: 3 grade system Laterality: Left Staged by: Pathologist and managing physician Stage used in treatment planning: Yes National guidelines used in treatment planning: Yes Type of national guideline used in treatment planning: NCCN - Pathologic stage from 02/10/2021: Stage IA (pT1b, pN0, cM0, G2, ER+, PR+, HER2-) - Signed by Truitt Merle, MD on 04/12/2021 Stage prefix: Initial diagnosis Histologic grading system: 3 grade system   SUMMARY OF ONCOLOGIC HISTORY: Oncology History Overview Note  Cancer Staging Malignant neoplasm of upper-outer quadrant of left breast in female, estrogen receptor positive ([place]) Staging form: Breast, AJCC 8th Edition - Clinical stage from 01/11/2021: Stage IA (cT1b, cN0, cM0, G2, ER+, PR+, HER2-) - Signed by Truitt Merle, MD on 01/11/2021 Stage prefix: Initial diagnosis Histologic grading system: 3 grade system Laterality: Left Staged by: Pathologist and managing physician Stage used in treatment planning: Yes National guidelines used in treatment planning: Yes Type of national guideline used in treatment planning: NCCN - Pathologic stage from 02/10/2021: Stage IA (pT1b, pN0, cM0, G2, ER+, PR+, HER2-) - Signed by Truitt Merle, MD on 04/12/2021 Stage prefix: Initial diagnosis Histologic grading system: 3 grade system    Malignant neoplasm of upper-outer quadrant of left breast in female, estrogen receptor positive ([place])  01/05/2021 Initial Biopsy   Diagnosis Breast, left, needle core biopsy, upper  outer - INVASIVE DUCTAL CARCINOMA, GRADE 1/2. - DUCTAL CARCINOMA IN SITU. Microscopic Comment The greatest tumor dimension is 0.8 cm. A breast prognostic profile will be performed. Dr. Tresa Moore agrees.   01/05/2021 Receptors her2   PROGNOSTIC INDICATORS Results: IMMUNOHISTOCHEMICAL AND MORPHOMETRIC ANALYSIS PERFORMED MANUALLY The tumor cells are NEGATIVE for Her2 (1+). Estrogen Receptor: 85%, POSITIVE, MODERATE STAINING INTENSITY Progesterone Receptor: 95%, POSITIVE, STRONG STAINING INTENSITY Proliferation Marker Ki67: 10%   01/05/2021 Mammogram   Mammogram  Left breast mass 1:30 position   01/10/2021 Initial Diagnosis   Malignant neoplasm of upper-outer quadrant of left breast in female, estrogen receptor positive ([place])   01/11/2021 Cancer Staging   Staging form: Breast, AJCC 8th Edition - Clinical stage from 01/11/2021: Stage IA (cT1b, cN0, cM0, G2, ER+, PR+, HER2-) - Signed by Truitt Merle, MD on 01/11/2021 Stage prefix: Initial diagnosis Histologic grading system: 3 grade system Laterality: Left Staged by: Pathologist and managing physician Stage used in treatment planning: Yes National guidelines used in treatment planning: Yes Type of national guideline used in treatment planning: NCCN   02/10/2021 Cancer Staging   Staging form: Breast, AJCC 8th Edition - Pathologic stage from 02/10/2021: Stage IA (pT1b, pN0, cM0, G2, ER+, PR+, HER2-) - Signed by Truitt Merle, MD on 04/12/2021 Stage prefix: Initial diagnosis Histologic grading system: 3 grade system   02/10/2021 Pathology Results   FINAL MICROSCOPIC DIAGNOSIS:   A. 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 BREAST, LEFT NEW MEDIAL/INFERIOR MARGIN,  EXCISION:  -  No residual carcinoma identified   C.  LYMPH NODE, LEFT AXILLARY, SENTINEL, EXCISION:  -  No carcinoma identified in one lymph node (0/1)   D. LYMPH NODE, LEFT AXILLARY, SENTINEL, EXCISION:  -  No carcinoma identified in one lymph node (0/1)   E. LYMPH NODE, LEFT AXILLARY, SENTINEL, EXCISION:  -  No carcinoma identified in one lymph node (0/1)    02/10/2021 Oncotype testing   Recurrence score of 6, risk of distant metastasis 3%, no benefit from chemo   03/27/2021 - 04/24/2021 Radiation Therapy   Under Dr. Lisbeth Renshaw     CURRENT THERAPY: Tamoxifen  INTERVAL HISTORY: Brandi Phelps 51 y.o. female returns for follow-up of her history of breast cancer currently on treatment with adjuvant tamoxifen.  She is tolerating the tamoxifen well does note intermittent hot flashes that are unpredictable when they occur some week she will go without any in some week she will have more frequently.  She has been doing weight watchers and has lost about 20 to 30 pounds.  She is walking about 2 days a week.  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  Her most recent mammogram was completed on Jan 01, 2022 and demonstrated no evidence of malignancy and breast density category C.  Repeat bilateral diagnostic mammogram was recommended for May 2024.  Brandi Phelps notes that she is up-to-date with seeing primary care regularly and she also is up-to-date with her cancer screenings.  Brandi Phelps notes some increased left scapular pain that happens from time to time she says that it will "catch". She thinks it started after she was pulling weeds and is going to monitor this.   Patient Active Problem List   Diagnosis Date Noted   Family history of prostate cancer    Family history of colon cancer    Family history of pancreatic cancer    Malignant neoplasm of upper-outer quadrant of left breast in female, estrogen receptor positive ([place]) 01/10/2021    is allergic to methotrexate  derivatives.  MEDICAL HISTORY: Past Medical History:  Diagnosis Date   Cancer ([place]) 01/2021   left breast IDC in situ   Complication of anesthesia    Family history of adverse reaction to anesthesia    Family history of colon cancer    Family history of pancreatic cancer    Family history of prostate cancer    Grover's disease     SURGICAL HISTORY: Past Surgical History:  Procedure Laterality Date   BREAST LUMPECTOMY WITH RADIOACTIVE SEED AND SENTINEL LYMPH NODE BIOPSY Left 02/10/2021   Procedure: LEFT BREAST LUMPECTOMY WITH RADIOACTIVE SEED AND SENTINEL LYMPH NODE BIOPSY;  Surgeon: Coralie Keens, MD;  Location: [facility];  Service: General;  Laterality: Left;   CESAREAN SECTION     x3    SOCIAL HISTORY: Social History   Socioeconomic History   Marital status: Married    Spouse name: Not on file   Number of children: Not on file   Years of education: Not on file   Highest education level: Not on file  Occupational History   Not on file  Tobacco Use   Smoking status: Former    Packs/day: 0.50    Years: 5.00    Total pack years: 2.50    Types: Cigarettes    Quit date: 01/11/1994    Years since quitting: 28.2   Smokeless tobacco: Never  Substance and  Sexual Activity   Alcohol use: Not Currently   Drug use: Never   Sexual activity: Yes    Birth control/protection: Condom  Other Topics Concern   Not on file  Social History Narrative   Not on file   Social Determinants of Health   Financial Resource Strain: Not on file  Food Insecurity: Not on file  Transportation Needs: Not on file  Physical Activity: Not on file  Stress: Not on file  Social Connections: Not on file  Intimate Partner Violence: Not on file    FAMILY HISTORY: Family History  Problem Relation Age of Onset   Prostate cancer Father 65   Myelodysplastic syndrome Father 59   Prostate cancer Paternal Uncle    Colon cancer Maternal Uncle        dx 53s   Cancer Paternal  Grandfather        mouth or throat, dx 70s/80s, smoker   Pancreatic cancer Maternal Uncle        dx 70s    Review of Systems  Constitutional:  Negative for appetite change, chills, fatigue, fever and unexpected weight change.  HENT:   Negative for hearing loss, lump/mass and trouble swallowing.   Eyes:  Negative for eye problems and icterus.  Respiratory:  Negative for chest tightness, cough and shortness of breath.   Cardiovascular:  Negative for chest pain, leg swelling and palpitations.  Gastrointestinal:  Negative for abdominal distention, abdominal pain, constipation, diarrhea, nausea and vomiting.  Endocrine: Positive for hot flashes.  Genitourinary:  Negative for difficulty urinating.   Musculoskeletal:  Negative for arthralgias.  Skin:  Negative for itching and rash.  Neurological:  Negative for dizziness, extremity weakness, headaches and numbness.  Hematological:  Negative for adenopathy. Does not bruise/bleed easily.  Psychiatric/Behavioral:  Negative for depression. The patient is not nervous/anxious.       PHYSICAL EXAMINATION  ECOG PERFORMANCE STATUS: 1 - Symptomatic but completely ambulatory  Vitals:   04/12/22 1055  BP: 109/74  Pulse: 70  Temp: 98.1 F (36.7 C)  SpO2: 100%    Physical Exam Constitutional:      General: She is not in acute distress.    Appearance: Normal appearance. She is not toxic-appearing.  HENT:     Head: Normocephalic and atraumatic.  Eyes:     General: No scleral icterus. Cardiovascular:     Rate and Rhythm: Normal rate and regular rhythm.     Pulses: Normal pulses.     Heart sounds: Normal heart sounds.  Pulmonary:     Effort: Pulmonary effort is normal.     Breath sounds: Normal breath sounds.  Abdominal:     General: Abdomen is flat. Bowel sounds are normal. There is no distension.     Palpations: Abdomen is soft.     Tenderness: There is no abdominal tenderness.  Musculoskeletal:        General: No swelling.      Cervical back: Neck supple.  Lymphadenopathy:     Cervical: No cervical adenopathy.  Skin:    General: Skin is warm and dry.     Findings: No rash.  Neurological:     General: No focal deficit present.     Mental Status: She is alert.  Psychiatric:        Mood and Affect: Mood normal.        Behavior: Behavior normal.     LABORATORY DATA:  CBC    Component Value Date/Time   WBC 4.4 04/12/2022 1039  RBC 4.20 04/12/2022 1039   HGB 12.8 04/12/2022 1039   HCT 37.8 04/12/2022 1039   PLT 215 04/12/2022 1039   MCV 90.0 04/12/2022 1039   MCH 30.5 04/12/2022 1039   MCHC 33.9 04/12/2022 1039   RDW 12.2 04/12/2022 1039   LYMPHSABS 1.1 04/12/2022 1039   MONOABS 0.3 04/12/2022 1039   EOSABS 0.5 04/12/2022 1039   BASOSABS 0.1 04/12/2022 1039    CMP     Component Value Date/Time   NA 140 04/12/2022 1039   K 3.9 04/12/2022 1039   CL 105 04/12/2022 1039   CO2 29 04/12/2022 1039   GLUCOSE 92 04/12/2022 1039   BUN 12 04/12/2022 1039   CREATININE 0.60 04/12/2022 1039   CALCIUM 9.2 04/12/2022 1039   PROT 7.2 04/12/2022 1039   ALBUMIN 4.3 04/12/2022 1039   AST 12 (L) 04/12/2022 1039   ALT 9 04/12/2022 1039   ALKPHOS 54 04/12/2022 1039   BILITOT 0.4 04/12/2022 1039   GFRNONAA >60 04/12/2022 1039        ASSESSMENT and THERAPY PLAN:   Malignant neoplasm of upper-outer quadrant of left breast in female, estrogen receptor positive ([employer]) Brandi Phelps is here today for follow-up of her stage Ia estrogen positive breast cancer status post lumpectomy, adjuvant radiation, and antiestrogen therapy with tamoxifen that began in September 2022.  Brandi Phelps has no clinical or radiographic sign of breast cancer recurrence.  She was recommended to continue tamoxifen as she is tolerating this moderately well.  She will continue annual mammograms that are diagnostic annually next due in May 2024.  We did review her breast density category C and what this means.  Health promotion: Recommended she continue  to follow-up with her primary care regularly and stay up-to-date with her cancer screenings.  Healthy diet and exercise continue to be encouraged.  We will see Brandi Phelps back in 6 months for continued labs and follow-up.  Her labs today were normal.    All questions were answered. The patient knows to call the clinic with any problems, questions or concerns. We can certainly see the patient much sooner if necessary.  Total encounter time:30 minutes*in face-to-face visit time, chart review, lab review, care coordination, order entry, and documentation of the encounter time.    Wilber Bihari, NP 04/12/22 11:43 AM Medical Oncology and Hematology [facility] [place], [place] [address] Tel. [phone]    Fax. [phone]  *Total Encounter Time as defined by the Centers for Medicare and Medicaid Services includes, in addition to the face-to-face time of a patient visit (documented in the note above) non-face-to-face time: obtaining and reviewing outside history, ordering and reviewing medications, tests or procedures, care coordination (communications with other health care professionals or caregivers) and documentation in the medical record.

## 2022-04-12 NOTE — Assessment & Plan Note (Signed)
Brandi Phelps is here today for follow-up of her stage Ia estrogen positive breast cancer status post lumpectomy, adjuvant radiation, and antiestrogen therapy with tamoxifen that began in September 2022.  Brandi Phelps has no clinical or radiographic sign of breast cancer recurrence.  She was recommended to continue tamoxifen as she is tolerating this moderately well.  She will continue annual mammograms that are diagnostic annually next due in May 2024.  We did review her breast density category C and what this means.  Health promotion: Recommended she continue to follow-up with her primary care regularly and stay up-to-date with her cancer screenings.  Healthy diet and exercise continue to be encouraged.  We will see Brandi Phelps back in 6 months for continued labs and follow-up.  Her labs today were normal.

## 2022-04-13 ENCOUNTER — Telehealth: Payer: Self-pay | Admitting: Adult Health

## 2022-04-13 NOTE — Telephone Encounter (Signed)
Contacted patient to scheduled appointments. Patient is aware of appointments that are scheduled.   

## 2022-05-28 ENCOUNTER — Ambulatory Visit: Payer: BC Managed Care – PPO | Attending: Surgery

## 2022-05-28 VITALS — Wt 170.5 lb

## 2022-05-28 DIAGNOSIS — Z483 Aftercare following surgery for neoplasm: Secondary | ICD-10-CM | POA: Insufficient documentation

## 2022-05-28 NOTE — Therapy (Signed)
  OUTPATIENT PHYSICAL THERAPY [facility] SCREENING NOTE   Patient Name: Brandi Phelps MRN: [id number] DOB:1971-07-10, 51 y.o., female Today's Date: 05/28/2022  PCP: [name], [place] REFERRING PROVIDER: Coralie Keens, MD   PT End of Session - 05/28/22 0930     Visit Number 5   # unchanged due to screen only   PT Start Time 0927    PT Stop Time 0932    PT Time Calculation (min) 5 min    Activity Tolerance Patient tolerated treatment well    Behavior During Therapy [facility] for tasks assessed/performed             Past Medical History:  Diagnosis Date   Cancer ([place]) 01/2021   left breast IDC in situ   Complication of anesthesia    Family history of adverse reaction to anesthesia    Family history of colon cancer    Family history of pancreatic cancer    Family history of prostate cancer    Grover's disease    Past Surgical History:  Procedure Laterality Date   BREAST LUMPECTOMY WITH RADIOACTIVE SEED AND SENTINEL LYMPH NODE BIOPSY Left 02/10/2021   Procedure: LEFT BREAST LUMPECTOMY WITH RADIOACTIVE SEED AND SENTINEL LYMPH NODE BIOPSY;  Surgeon: Coralie Keens, MD;  Location: [facility];  Service: General;  Laterality: Left;   CESAREAN SECTION     x3   Patient Active Problem List   Diagnosis Date Noted   Family history of prostate cancer    Family history of colon cancer    Family history of pancreatic cancer    Malignant neoplasm of upper-outer quadrant of left breast in female, estrogen receptor positive ([place]) 01/10/2021    REFERRING DIAG: left breast cancer at risk for lymphedema  THERAPY DIAG: Aftercare following surgery for neoplasm  PERTINENT HISTORY: Patient was diagnosed on 12/19/2020 with 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 She underwent a left lumpcetomy and sentinel node biopsy (3 negative nodes) on 02/10/2021. It is ER/PR positive and HER2 negative with a Ki67 of 10%. Radiation completed.  Taking tamoxifen    PRECAUTIONS: left UE Lymphedema risk, None  SUBJECTIVE: Pt returns for her 3 month L-Dex screen.   PAIN:  Are you having pain? No  SOZO SCREENING: Patient was assessed today using the SOZO machine to determine the lymphedema index score. This was compared to her baseline score. It was determined that she is within the recommended range when compared to her baseline and no further action is needed at this time. She will continue SOZO screenings. These are done every 3 months for 2 years post operatively followed by every 6 months for 2 years, and then annually.   L-DEX FLOWSHEETS - 05/28/22 0900       L-DEX LYMPHEDEMA SCREENING   Measurement Type Unilateral    L-DEX MEASUREMENT EXTREMITY Upper Extremity    POSITION  Standing    DOMINANT SIDE Right    At Risk Side Left    BASELINE SCORE (UNILATERAL) -0.6    L-DEX SCORE (UNILATERAL) -1    VALUE CHANGE (UNILAT) -0.4              Otelia Limes, PTA 05/28/2022, 9:34 AM

## 2022-08-13 ENCOUNTER — Ambulatory Visit: Payer: BC Managed Care – PPO | Attending: Surgery

## 2022-08-13 VITALS — Wt 176.0 lb

## 2022-08-13 DIAGNOSIS — Z483 Aftercare following surgery for neoplasm: Secondary | ICD-10-CM | POA: Insufficient documentation

## 2022-08-13 NOTE — Therapy (Signed)
  OUTPATIENT PHYSICAL THERAPY [facility] SCREENING NOTE   Patient Name: Brandi Phelps MRN: [id number] DOB:07-30-1971, 51 y.o., female Today's Date: 08/13/2022  PCP: [name], [place] REFERRING PROVIDER: Coralie Keens, MD   PT End of Session - 08/13/22 [phone]     Visit Number 5   # unchanged due to screen only   PT Start Time 0919    PT Stop Time 0924    PT Time Calculation (min) 5 min    Activity Tolerance Patient tolerated treatment well    Behavior During Therapy [facility] for tasks assessed/performed             Past Medical History:  Diagnosis Date   Cancer ([place]) 01/2021   left breast IDC in situ   Complication of anesthesia    Family history of adverse reaction to anesthesia    Family history of colon cancer    Family history of pancreatic cancer    Family history of prostate cancer    Grover's disease    Past Surgical History:  Procedure Laterality Date   BREAST LUMPECTOMY WITH RADIOACTIVE SEED AND SENTINEL LYMPH NODE BIOPSY Left 02/10/2021   Procedure: LEFT BREAST LUMPECTOMY WITH RADIOACTIVE SEED AND SENTINEL LYMPH NODE BIOPSY;  Surgeon: Coralie Keens, MD;  Location: [facility];  Service: General;  Laterality: Left;   CESAREAN SECTION     x3   Patient Active Problem List   Diagnosis Date Noted   Family history of prostate cancer    Family history of colon cancer    Family history of pancreatic cancer    Malignant neoplasm of upper-outer quadrant of left breast in female, estrogen receptor positive ([place]) 01/10/2021    REFERRING DIAG: left breast cancer at risk for lymphedema  THERAPY DIAG: Aftercare following surgery for neoplasm  PERTINENT HISTORY: Patient was diagnosed on 12/19/2020 with 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 She underwent a left lumpcetomy and sentinel node biopsy (3 negative nodes) on 02/10/2021. It is ER/PR positive and HER2 negative with a Ki67 of 10%. Radiation completed.  Taking tamoxifen    PRECAUTIONS: left UE Lymphedema risk, None  SUBJECTIVE: Pt returns for her 3 month L-Dex screen.   PAIN:  Are you having pain? No  [facility] SCREENING: Patient was assessed today using the SOZO machine to determine the lymphedema index score. This was compared to her baseline score. It was determined that she is within the recommended range when compared to her baseline and no further action is needed at this time. She will continue SOZO screenings. These are done every 3 months for 2 years post operatively followed by every 6 months for 2 years, and then annually.   L-DEX FLOWSHEETS - 08/13/22 0900       L-DEX LYMPHEDEMA SCREENING   Measurement Type Unilateral    L-DEX MEASUREMENT EXTREMITY Upper Extremity    POSITION  Standing    DOMINANT SIDE Right    At Risk Side Left    BASELINE SCORE (UNILATERAL) -0.6    L-DEX SCORE (UNILATERAL) 0.9    VALUE CHANGE (UNILAT) 1.5              Otelia Limes, PTA 08/13/2022, 9:22 AM

## 2022-08-21 ENCOUNTER — Other Ambulatory Visit: Payer: Self-pay | Admitting: Nurse Practitioner

## 2022-09-10 ENCOUNTER — Encounter: Payer: Self-pay | Admitting: Obstetrics & Gynecology

## 2022-09-10 ENCOUNTER — Ambulatory Visit: Payer: BC Managed Care – PPO | Admitting: Obstetrics & Gynecology

## 2022-09-10 VITALS — BP 118/80

## 2022-09-10 DIAGNOSIS — B3731 Acute candidiasis of vulva and vagina: Secondary | ICD-10-CM

## 2022-09-10 DIAGNOSIS — R300 Dysuria: Secondary | ICD-10-CM | POA: Diagnosis not present

## 2022-09-10 DIAGNOSIS — N898 Other specified noninflammatory disorders of vagina: Secondary | ICD-10-CM

## 2022-09-10 LAB — WET PREP FOR TRICH, YEAST, CLUE

## 2022-09-10 MED ORDER — FLUCONAZOLE 150 MG PO TABS: 150.0000 mg | ORAL_TABLET | ORAL | 2 refills | Status: AC

## 2022-09-10 NOTE — Progress Notes (Signed)
    Brandi Phelps 08-19-1971 [id number]        52 y.o.  [id number] Married  RP: Vaginal and vulvar irritation x 3 days  HPI: Vaginal and vulvar irritation x 3 days.  Some dysuria.  No pelvic pain.  No fever. Declines STI screen.   OB History  Gravida Para Term Preterm AB Living  '3 3 3     3  '$ SAB IAB Ectopic Multiple Live Births          3    # Outcome Date GA Lbr Len/2nd Weight Sex Delivery Anes PTL Lv  3 Term      CS-LTranv   LIV  2 Term      CS-Unspec   LIV  1 Term      CS-LTranv   LIV    Past medical history,surgical history, problem list, medications, allergies, family history and social history were all reviewed and documented in the EPIC chart.   Directed ROS with pertinent positives and negatives documented in the history of present illness/assessment and plan.  Exam:  Vitals:   09/10/22 1350  BP: 118/80   General appearance:  Normal  CVAT Neg bilaterally  Abdomen: Normal  Gynecologic exam: Vulva with erythema.  Speculum:  Cervix/vagina normal.  Thick yeasty discharge.  Wet prep done.  Declines STI screen.  U/A: Yellow, clear, Pro Neg, Nit Neg, WBC 0-5, RBC Neg, Bacteria Moderate.  No yeast. Wet prep: Yeasts present with budding and pseudohyphae.   Assessment/Plan:  52 y.o. G3P3003   1. Vaginal itching Vaginal and vulvar irritation x 3 days.  Some dysuria.  No pelvic pain.  No fever. Declines STI screen.  Severe yeast vaginitis confirmed by Wet Prep.  Will treat with Fluconazole 1 tab PO every other day x 3.  2 refills.  Probiotics, yogurt, Boric Acid as needed for prevention. - WET PREP FOR [place], YEAST, CLUE  2. Dysuria U/A mildly perturbed, will wait on U. Culture to decide if treatment is needed. - Urinalysis,Complete w/RFL Culture  Other orders - fluconazole (DIFLUCAN) 150 MG tablet; Take 1 tablet (150 mg total) by mouth every other day for 3 doses.   Princess Bruins MD, 2:13 PM 09/10/2022

## 2022-09-12 LAB — URINALYSIS, COMPLETE W/RFL CULTURE
Bilirubin Urine: NEGATIVE
Glucose, UA: NEGATIVE
Hgb urine dipstick: NEGATIVE
Hyaline Cast: NONE SEEN /LPF
Ketones, ur: NEGATIVE
Nitrites, Initial: NEGATIVE
Protein, ur: NEGATIVE
RBC / HPF: NONE SEEN /HPF (ref 0–2)
Specific Gravity, Urine: 1.025 (ref 1.001–1.035)
pH: 6 (ref 5.0–8.0)

## 2022-09-12 LAB — CULTURE INDICATED

## 2022-09-12 LAB — URINE CULTURE
MICRO NUMBER:: 14401393
Result:: NO GROWTH
SPECIMEN QUALITY:: ADEQUATE

## 2022-09-24 ENCOUNTER — Encounter: Payer: Self-pay | Admitting: Obstetrics & Gynecology

## 2022-09-24 NOTE — Telephone Encounter (Signed)
Spoke with patient.  Patient took Diflucan as prescribed per Dr. Dellis Filbert on 09/10/22; 1 tab PO q other day for 3 days for a total of 6 doses. She refilled medication x1, vaginitis symptoms not completely resolved. Took last dose of Diflucan on 1/18/. Noticed on 1/20 right side of lips into the gums felt "numb" and by the afternoon moved across the lips. Took OTC benadryl and symptoms improved. As of today patient reports small area of the corner of lips still tingling. Denies any other symptoms. Oriented x4. Is concerned that she may have experienced a medication interaction with diflucan and tamoxifen, held her Tamoxifen 2 days ago until symptoms resolve.   Reports yeast symptoms present, but not as significant as when she came into the office. Reports itching when wiping and wetness. Denies vaginal discharge or odor. States she was advised by Dr. Dellis Filbert ok to continue to use OTC vaginal probiotic, "Physicians Choice Daily", hs continued to use this.   Advised patient to stop diflucan. I will review with covering provider and return call with recommendations, patient agreeable.   Dr. Quincy Simmonds -please review and advise.

## 2022-09-24 NOTE — Telephone Encounter (Signed)
Reviewed 1/8 OV; Fluconazole 150 mg tab, Take 1 tablet (150 mg total) by mouth every other day for 3 doses. Dispense 3 tabs/2 RF.   Call to patient, Left message to call [facility] triage at [facility], [phone], OPT 4.

## 2022-09-24 NOTE — Telephone Encounter (Signed)
Spoke with patient, advised per Dr. Quincy Simmonds.  Patient declines OV at this time, will call back if symptoms do not completely resolve.  Allergy list updated.  Questions answered.   Encounter closed.

## 2022-09-24 NOTE — Telephone Encounter (Signed)
This is Dr. Quincy Simmonds responding for Dr. Dellis Filbert who is out of the office.   I recommend that the patient stop the Diflucan completely.  It is possible that she developed an allergic reaction to the Diflucan.   There is no contraindication of taking the Diflucan with Tamoxifen.   If she is still having vaginitis symptoms, please have her make an appointment with her primary gynecologist, Dr. Talbert Nan.

## 2022-10-04 NOTE — Progress Notes (Signed)
52 y.o. G90P3003 Married White or Caucasian Not Hispanic or Latino female here for annual exam.   Period Duration (Days): 3 Period Pattern: (!) Irregular Menstrual Flow: Heavy Menstrual Control: Tampon Menstrual Control Change Freq (Hours): 2 She had her mirena IUD removed in the fall of 2022. She was having monthly cycles for a few months until she started Tamoxifen. They have been coming every 3-4 months for 3-5 days, can saturate a super tampon in up to 2 hours.  She was having night sweats, currently better. She is having a couple of hot flashes a week (improved). No significant vaginal dryness, no dyspareunia.   H/O breast cancer, s/p left lumpectomy in 10/22, s/p radiation, on tamoxifen.  She broke her right foot last month, missed a step on the stairs and fell.   Patient saw Dr. Dellis Filbert on 09/10/22 and was given diflucan for a yeast infection. She states that her lips and gums went numb after taking it.   No bowel or bladder c/o.   Patient's last menstrual period was 09/02/2022.         Had not had one since oct.  Sexually active: Yes.    The current method of family planning is condoms most of the time.    Exercising: No.  Exercise is limited by orthopedic condition(s): broken foot.. Smoker:  no  Health Maintenance: Pap:  07/24/21 WNL Hr HPV Neg; 03/17/20 ASC-H, neg hpv (initially no evaluation, colpo in 12/22, unsatisfactory) with negative ECC History of abnormal Pap:   yes, last year. She isn't sure what the pap was, but was told the hpv was negative.  Her husband has a h/o throat cancer with hpv.  MMG:  01/01/22 density C Bi-rads 2 benign  BMD:   n/a Colonoscopy: 07/10/22 polyps seen, f/u in 3-5 years TDaP:  due, declines today will get it with her primary Gardasil: n/a   reports that she quit smoking about 28 years ago. Her smoking use included cigarettes. She has a 2.50 pack-year smoking history. She has never used smokeless tobacco. She reports that she does not currently use  alcohol. She reports that she does not use drugs. She has 3 kids: 24, 46 and 51.  Past Medical History:  Diagnosis Date   Cancer ([place]) 01/2021   left breast IDC in situ   Complication of anesthesia    Family history of adverse reaction to anesthesia    Family history of colon cancer    Family history of pancreatic cancer    Family history of prostate cancer    Grover's disease     Past Surgical History:  Procedure Laterality Date   BREAST LUMPECTOMY WITH RADIOACTIVE SEED AND SENTINEL LYMPH NODE BIOPSY Left 02/10/2021   Procedure: LEFT BREAST LUMPECTOMY WITH RADIOACTIVE SEED AND SENTINEL LYMPH NODE BIOPSY;  Surgeon: Coralie Keens, MD;  Location: [place];  Service: General;  Laterality: Left;   CESAREAN SECTION     x3    Current Outpatient Medications  Medication Sig Dispense Refill   ibuprofen (ADVIL) 200 MG tablet Take 200 mg by mouth every 6 (six) hours as needed.     Multiple Vitamins-Minerals (AIRBORNE GUMMIES PO) Take by mouth.     tamoxifen (NOLVADEX) 20 MG tablet Take 1 tablet by mouth once daily 90 tablet 0   No current facility-administered medications for this visit.    Family History  Problem Relation Age of Onset   Prostate cancer Father 74   Myelodysplastic syndrome Father 71   Prostate  cancer Paternal Uncle    Colon cancer Maternal Uncle        dx 22s   Cancer Paternal Grandfather        mouth or throat, dx 70s/80s, smoker   Pancreatic cancer Maternal Uncle        dx 6s    Review of Systems  All other systems reviewed and are negative.   Exam:   BP 110/60   Pulse 77   LMP 09/02/2022   SpO2 100%   Weight change: @WEIGHTCHANGE$ @ Height:      Ht Readings from Last 3 Encounters:  08/14/21 5' 7"$  (1.702 m)  07/24/21 5' 7"$  (1.702 m)  07/13/21 5' 7"$  (1.702 m)    General appearance: alert, cooperative and appears stated age Head: Normocephalic, without obvious abnormality, atraumatic Neck: no adenopathy, supple, symmetrical,  trachea midline and thyroid normal to inspection and palpation Lungs: clear to auscultation bilaterally Cardiovascular: regular rate and rhythm Breasts: normal appearance, no masses or tenderness, evidence of left lumpectomy Abdomen: soft, non-tender; non distended,  no masses,  no organomegaly Extremities: extremities normal, atraumatic, no cyanosis or edema Skin: Skin color, texture, turgor normal. No rashes or lesions Lymph nodes: Cervical, supraclavicular, and axillary nodes normal. No abnormal inguinal nodes palpated Neurologic: Grossly normal   Pelvic: External genitalia:  no lesions              Urethra:  normal appearing urethra with no masses, tenderness or lesions              Bartholins and Skenes: normal                 Vagina: normal appearing vagina with normal color and discharge, no lesions              Cervix: no lesions               Bimanual Exam:  Uterus:  normal size, contour, position, consistency, mobility, non-tender              Adnexa: no mass, fullness, tenderness               Rectovaginal: Confirms               Anus:  normal sphincter tone, no lesions  Gae Dry, CMA chaperoned for the exam.  1. Well woman exam Discussed breast self exam Discussed calcium and vit D intake Mammogram due in 5/24 Colonoscopy UTD TDAP due, she will get it with her primary  2. Screening for cervical cancer - Cytology - PAP  3. History of breast cancer On tamoxifen, followed by Oncology  4. Perimenopausal Having irregular cycles Discussed parameters for when to call for irregular bleeding

## 2022-10-12 ENCOUNTER — Inpatient Hospital Stay: Payer: BC Managed Care – PPO | Admitting: Hematology

## 2022-10-12 ENCOUNTER — Inpatient Hospital Stay: Payer: BC Managed Care – PPO | Attending: Hematology

## 2022-10-12 ENCOUNTER — Encounter: Payer: Self-pay | Admitting: Hematology

## 2022-10-12 VITALS — BP 108/77 | HR 77 | Temp 98.2°F | Resp 14 | Wt 182.7 lb

## 2022-10-12 DIAGNOSIS — Z8042 Family history of malignant neoplasm of prostate: Secondary | ICD-10-CM | POA: Diagnosis not present

## 2022-10-12 DIAGNOSIS — Z79899 Other long term (current) drug therapy: Secondary | ICD-10-CM | POA: Diagnosis not present

## 2022-10-12 DIAGNOSIS — Z170 Estrogen receptor positive status [ER+]: Secondary | ICD-10-CM

## 2022-10-12 DIAGNOSIS — Z79810 Long term (current) use of selective estrogen receptor modulators (SERMs): Secondary | ICD-10-CM | POA: Insufficient documentation

## 2022-10-12 DIAGNOSIS — R232 Flushing: Secondary | ICD-10-CM | POA: Insufficient documentation

## 2022-10-12 DIAGNOSIS — C50412 Malignant neoplasm of upper-outer quadrant of left female breast: Secondary | ICD-10-CM | POA: Insufficient documentation

## 2022-10-12 DIAGNOSIS — Z87891 Personal history of nicotine dependence: Secondary | ICD-10-CM | POA: Diagnosis not present

## 2022-10-12 DIAGNOSIS — Z800 Family history of malignant neoplasm of digestive organs: Secondary | ICD-10-CM | POA: Insufficient documentation

## 2022-10-12 LAB — CMP (CANCER CENTER ONLY)
ALT: 7 U/L (ref 0–44)
AST: 11 U/L — ABNORMAL LOW (ref 15–41)
Albumin: 4.2 g/dL (ref 3.5–5.0)
Alkaline Phosphatase: 54 U/L (ref 38–126)
Anion gap: 6 (ref 5–15)
BUN: 12 mg/dL (ref 6–20)
CO2: 29 mmol/L (ref 22–32)
Calcium: 9.5 mg/dL (ref 8.9–10.3)
Chloride: 105 mmol/L (ref 98–111)
Creatinine: 0.61 mg/dL (ref 0.44–1.00)
GFR, Estimated: >60 mL/min (ref >60)
Glucose, Bld: 96 mg/dL (ref 70–99)
Potassium: 4.1 mmol/L (ref 3.5–5.1)
Sodium: 140 mmol/L (ref 135–145)
Total Bilirubin: 0.5 mg/dL (ref 0.3–1.2)
Total Protein: 7.1 g/dL (ref 6.5–8.1)

## 2022-10-12 LAB — CBC WITH DIFFERENTIAL (CANCER CENTER ONLY)
Abs Immature Granulocytes: 0.01 10*3/uL (ref 0.00–0.07)
Basophils Absolute: 0.1 10*3/uL (ref 0.0–0.1)
Basophils Relative: 1 %
Eosinophils Absolute: 0.4 10*3/uL (ref 0.0–0.5)
Eosinophils Relative: 8 %
HCT: 39.8 % (ref 36.0–46.0)
Hemoglobin: 13.4 g/dL (ref 12.0–15.0)
Immature Granulocytes: 0 %
Lymphocytes Relative: 26 %
Lymphs Abs: 1.1 10*3/uL (ref 0.7–4.0)
MCH: 30.6 pg (ref 26.0–34.0)
MCHC: 33.7 g/dL (ref 30.0–36.0)
MCV: 90.9 fL (ref 80.0–100.0)
Monocytes Absolute: 0.4 10*3/uL (ref 0.1–1.0)
Monocytes Relative: 8 %
Neutro Abs: 2.5 10*3/uL (ref 1.7–7.7)
Neutrophils Relative %: 57 %
Platelet Count: 230 10*3/uL (ref 150–400)
RBC: 4.38 MIL/uL (ref 3.87–5.11)
RDW: 12 % (ref 11.5–15.5)
WBC Count: 4.4 10*3/uL (ref 4.0–10.5)
nRBC: 0 % (ref 0.0–0.2)

## 2022-10-12 NOTE — Assessment & Plan Note (Signed)
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  -Oncotype DX recurrence score of 6 (low risk) -she received radiation 7/25-8/22/22 under Dr. Lisbeth Renshaw. -she began tamoxifen in 05/2021. She is tolerating well overall with some hot flashes. -she is clinically doing well. Labs reviewed, no concern. Physical exam was unremarkable. There is no clinical concern for recurrence

## 2022-10-12 NOTE — Progress Notes (Signed)
Brandi Phelps   Telephone:([phone]) [phone] Fax:([phone]) [phone]   Clinic Follow up Note   Patient Care Team: [name], [place] as PCP - General (Family Medicine) Coralie Keens, MD as Consulting Physician (General Surgery) Truitt Merle, MD as Consulting Physician (Hematology) Kyung Rudd, MD as Consulting Physician (Radiation Oncology) Alla Feeling, NP as Nurse Practitioner (Nurse Practitioner)  Date of Service:  10/12/2022  CHIEF COMPLAINT: f/u of  left breast cancer    CURRENT THERAPY:   Tamoxifen, started 05/2021   ASSESSMENT:  Brandi Phelps is a 52 y.o. 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  -Oncotype DX recurrence score of 6 (low risk) -she received radiation 7/25-8/22/22 under Dr. Lisbeth Renshaw. -she began tamoxifen in 05/2021. She is tolerating well overall with some hot flashes. -she is clinically doing well. Labs reviewed, no concern. Physical exam was unremarkable. There is no clinical concern for recurrence    PLAN: - Continue Tamoxifen -next diagnostic Mammogram in May, ordered today  -lab reviewed - f/u with APP in 6 months  SUMMARY OF ONCOLOGIC HISTORY: Oncology History Overview Note  Cancer Staging Malignant neoplasm of upper-outer quadrant of left breast in female, estrogen receptor positive ([place]) Staging form: Breast, AJCC 8th Edition - Clinical stage from 01/11/2021: Stage IA (cT1b, cN0, cM0, G2, ER+, PR+, HER2-) - Signed by Truitt Merle, MD on 01/11/2021 Stage prefix: Initial diagnosis Histologic grading system: 3 grade system Laterality:  Left Staged by: Pathologist and managing physician Stage used in treatment planning: Yes National guidelines used in treatment planning: Yes Type of national guideline used in treatment planning: NCCN - Pathologic stage from 02/10/2021: Stage IA (pT1b, pN0, cM0, G2, ER+, PR+, HER2-) - Signed by Truitt Merle, MD on 04/12/2021 Stage prefix: Initial diagnosis Histologic grading system: 3 grade system    Malignant neoplasm of upper-outer quadrant of left breast in female, estrogen receptor positive ([place])  01/05/2021 Initial Biopsy   Diagnosis Breast, left, needle core biopsy, upper outer - INVASIVE DUCTAL CARCINOMA, GRADE 1/2. - DUCTAL CARCINOMA IN SITU. Microscopic Comment The greatest tumor dimension is 0.8 cm. A breast prognostic profile will be performed. Dr. Tresa Moore agrees.   01/05/2021 Receptors her2   PROGNOSTIC INDICATORS Results: IMMUNOHISTOCHEMICAL AND MORPHOMETRIC ANALYSIS PERFORMED MANUALLY The tumor cells are NEGATIVE for Her2 (1+). Estrogen Receptor: 85%, POSITIVE, MODERATE STAINING INTENSITY Progesterone Receptor: 95%, POSITIVE, STRONG STAINING INTENSITY Proliferation Marker Ki67: 10%   01/05/2021 Mammogram   Mammogram  Left breast mass 1:30 position   01/10/2021 Initial Diagnosis   Malignant neoplasm of upper-outer quadrant of left breast in female, estrogen receptor positive ([place])   01/11/2021 Cancer Staging   Staging form: Breast, AJCC 8th Edition - Clinical stage from 01/11/2021: Stage IA (cT1b, cN0, cM0, G2, ER+, PR+, HER2-) - Signed by Truitt Merle, MD on 01/11/2021 Stage prefix: Initial diagnosis Histologic grading system: 3 grade system Laterality: Left Staged by: Pathologist and managing physician Stage used in treatment planning: Yes National guidelines used in treatment planning: Yes Type of national guideline used in treatment planning: NCCN   02/10/2021 Cancer Staging   Staging form: Breast, AJCC 8th Edition - Pathologic stage from 02/10/2021: Stage  IA (pT1b, pN0,  cM0, G2, ER+, PR+, HER2-) - Signed by Truitt Merle, MD on 04/12/2021 Stage prefix: Initial diagnosis Histologic grading system: 3 grade system   02/10/2021 Pathology Results   FINAL MICROSCOPIC DIAGNOSIS:   A. 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 BREAST, LEFT NEW MEDIAL/INFERIOR MARGIN, EXCISION:  -  No residual carcinoma identified   C. LYMPH NODE, LEFT AXILLARY, SENTINEL, EXCISION:  -  No carcinoma identified in one lymph node (0/1)   D. LYMPH NODE, LEFT AXILLARY, SENTINEL, EXCISION:  -  No carcinoma identified in one lymph node (0/1)   E. LYMPH NODE, LEFT AXILLARY, SENTINEL, EXCISION:  -  No carcinoma identified in one lymph node (0/1)    02/10/2021 Oncotype testing   Recurrence score of 6, risk of distant metastasis 3%, no benefit from chemo   03/27/2021 - 04/24/2021 Radiation Therapy   Under Dr. Lisbeth Renshaw      INTERVAL HISTORY:  Brandi Phelps is here for a follow up of  left breast cancer   She was last seen by NP Wilber Bihari on 04/12/2022 She presents to the clinic alone. Pt reports she broke her foot in September 04, 2022. Pt state that her foot still feels sore. She goes  back in two weeks for X-rays. Pt is back at normal activities. Hot flashes are tolerable. Pt reports she had a period last week fr the firat time.   All other systems were reviewed with the patient and are negative.  MEDICAL HISTORY:  Past Medical History:  Diagnosis Date   Cancer ([place]) 01/2021   left breast IDC in situ   Complication of anesthesia    Family history of adverse reaction to anesthesia    Family history of colon cancer    Family history of pancreatic cancer    Family history of prostate cancer    Grover's disease     SURGICAL HISTORY: Past  Surgical History:  Procedure Laterality Date   BREAST LUMPECTOMY WITH RADIOACTIVE SEED AND SENTINEL LYMPH NODE BIOPSY Left 02/10/2021   Procedure: LEFT BREAST LUMPECTOMY WITH RADIOACTIVE SEED AND SENTINEL LYMPH NODE BIOPSY;  Surgeon: Coralie Keens, MD;  Location: [place];  Service: General;  Laterality: Left;   CESAREAN SECTION     x3    I have reviewed the social history and family history with the patient and they are unchanged from previous note.  ALLERGIES:  is allergic to methotrexate derivatives and diflucan [fluconazole].  MEDICATIONS:  Current Outpatient Medications  Medication Sig Dispense Refill   ibuprofen (ADVIL) 200 MG tablet Take 200 mg by mouth every 6 (six) hours as needed.     Multiple Vitamins-Minerals (AIRBORNE GUMMIES PO) Take by mouth.     tamoxifen (NOLVADEX) 20 MG tablet Take 1 tablet by mouth once daily 90 tablet 0   No current facility-administered medications for this visit.    PHYSICAL EXAMINATION: ECOG PERFORMANCE STATUS: 0 - Asymptomatic  Vitals:   10/12/22 1039  BP: 108/77  Pulse: 77  Resp: 14  Temp: 98.2 F (36.8 C)  SpO2: 100%   Wt Readings from Last 3 Encounters:  10/12/22 182 lb 11.2 oz (82.9 kg)  08/13/22 176 lb (79.8 kg)  05/28/22 170 lb 8 oz (  77.3 kg)    NECK: (-) supple, thyroid normal size, non-tender, without nodularity LYMPH: (-)  no palpable lymphadenopathy in the cervical, axillary  ABDOMEN:(-) abdomen soft, non-tender and normal bowel sounds BREAST: RT Breast no palpable mass breast exam benign  LT Breast some scar tissue a little firm. No palpable mass breast exam benign. LABORATORY DATA:  I have reviewed the data as listed    Latest Ref Rng & Units 10/12/2022   10:11 AM 04/12/2022   10:39 AM 10/13/2021    9:42 AM  CBC  WBC 4.0 - 10.5 K/uL 4.4  4.4  4.9   Hemoglobin 12.0 - 15.0 g/dL 13.4  12.8  12.5   Hematocrit 36.0 - 46.0 % 39.8  37.8  37.8   Platelets 150 - 400 K/uL 230  215  190         Latest  Ref Rng & Units 10/12/2022   10:11 AM 04/12/2022   10:39 AM 10/13/2021    9:42 AM  CMP  Glucose 70 - 99 mg/dL 96  92  81   BUN 6 - 20 mg/dL 12  12  16   $ Creatinine 0.44 - 1.00 mg/dL 0.61  0.60  0.68   Sodium 135 - 145 mmol/L 140  140  141   Potassium 3.5 - 5.1 mmol/L 4.1  3.9  4.3   Chloride 98 - 111 mmol/L 105  105  106   CO2 22 - 32 mmol/L 29  29  30   $ Calcium 8.9 - 10.3 mg/dL 9.5  9.2  9.2   Total Protein 6.5 - 8.1 g/dL 7.1  7.2  6.7   Total Bilirubin 0.3 - 1.2 mg/dL 0.5  0.4  0.4   Alkaline Phos 38 - 126 U/L 54  54  49   AST 15 - 41 U/L 11  12  14   $ ALT 0 - 44 U/L 7  9  10       $ RADIOGRAPHIC STUDIES: I have personally reviewed the radiological images as listed and agreed with the findings in the report. No results found.    Orders Placed This Encounter  Procedures   MM DIAG BREAST TOMO BILATERAL    Standing Status:   Future    Standing Expiration Date:   10/13/2023    Order Specific Question:   Reason for Exam (SYMPTOM  OR DIAGNOSIS REQUIRED)    Answer:   screenign    Order Specific Question:   Preferred imaging location?    Answer:   [facility]    Order Specific Question:   Is the patient pregnant?    Answer:   No   All questions were answered. The patient knows to call the clinic with any problems, questions or concerns. No barriers to learning was detected. The total time spent in the appointment was 25 minutes.     Truitt Merle, MD 10/12/2022   Felicity Coyer, CMA, am acting as scribe for Truitt Merle, MD.   I have reviewed the above documentation for accuracy and completeness, and I agree with the above.

## 2022-10-16 ENCOUNTER — Ambulatory Visit (INDEPENDENT_AMBULATORY_CARE_PROVIDER_SITE_OTHER): Payer: BC Managed Care – PPO | Admitting: Obstetrics and Gynecology

## 2022-10-16 ENCOUNTER — Encounter: Payer: Self-pay | Admitting: Obstetrics and Gynecology

## 2022-10-16 ENCOUNTER — Other Ambulatory Visit (HOSPITAL_COMMUNITY)
Admission: RE | Admit: 2022-10-16 | Discharge: 2022-10-16 | Disposition: A | Discharge status: Home / Self Care | Payer: BC Managed Care – PPO | Source: Ambulatory Visit | Attending: Obstetrics and Gynecology | Admitting: Obstetrics and Gynecology

## 2022-10-16 VITALS — BP 110/60 | HR 77

## 2022-10-16 DIAGNOSIS — Z853 Personal history of malignant neoplasm of breast: Secondary | ICD-10-CM

## 2022-10-16 DIAGNOSIS — Z01419 Encounter for gynecological examination (general) (routine) without abnormal findings: Secondary | ICD-10-CM

## 2022-10-16 DIAGNOSIS — N951 Menopausal and female climacteric states: Secondary | ICD-10-CM

## 2022-10-16 DIAGNOSIS — Z124 Encounter for screening for malignant neoplasm of cervix: Secondary | ICD-10-CM

## 2022-10-16 NOTE — Patient Instructions (Signed)

## 2022-10-18 LAB — CYTOLOGY - PAP
Comment: NEGATIVE
Diagnosis: NEGATIVE
High risk HPV: NEGATIVE

## 2022-11-12 ENCOUNTER — Ambulatory Visit: Payer: BC Managed Care – PPO | Attending: Surgery

## 2022-11-12 VITALS — Wt 182.1 lb

## 2022-11-12 DIAGNOSIS — Z483 Aftercare following surgery for neoplasm: Secondary | ICD-10-CM | POA: Insufficient documentation

## 2022-11-12 NOTE — Therapy (Signed)
  OUTPATIENT PHYSICAL THERAPY SOZO SCREENING NOTE   Patient Name: Brandi Phelps MRN: [id number] DOB:Jan 23, 1971, 52 y.o., female Today's Date: 11/12/2022  PCP: [name], FNP REFERRING PROVIDER: Coralie Keens, MD   PT End of Session - 11/12/22 1036     Visit Number 5   # unchanged due to screen only   PT Start Time 1034    PT Stop Time 1038    PT Time Calculation (min) 4 min    Activity Tolerance Patient tolerated treatment well    Behavior During Therapy [facility] for tasks assessed/performed             Past Medical History:  Diagnosis Date   Cancer ([place]) 01/2021   left breast IDC in situ   Complication of anesthesia    Family history of adverse reaction to anesthesia    Family history of colon cancer    Family history of pancreatic cancer    Family history of prostate cancer    Grover's disease    Past Surgical History:  Procedure Laterality Date   BREAST LUMPECTOMY WITH RADIOACTIVE SEED AND SENTINEL LYMPH NODE BIOPSY Left 02/10/2021   Procedure: LEFT BREAST LUMPECTOMY WITH RADIOACTIVE SEED AND SENTINEL LYMPH NODE BIOPSY;  Surgeon: Coralie Keens, MD;  Location: [place];  Service: General;  Laterality: Left;   CESAREAN SECTION     x3   Patient Active Problem List   Diagnosis Date Noted   Family history of prostate cancer    Family history of colon cancer    Family history of pancreatic cancer    Malignant neoplasm of upper-outer quadrant of left breast in female, estrogen receptor positive ([place]) 01/10/2021    REFERRING DIAG: left breast cancer at risk for lymphedema  THERAPY DIAG: Aftercare following surgery for neoplasm  PERTINENT HISTORY: Patient was diagnosed on 12/19/2020 with 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 She underwent a left lumpcetomy and sentinel node biopsy (3 negative nodes) on 02/10/2021. It is ER/PR positive and HER2 negative with a Ki67 of 10%. Radiation completed.  Taking tamoxifen    PRECAUTIONS: left UE Lymphedema risk, None  SUBJECTIVE: Pt returns for her 3 month L-Dex screen.   PAIN:  Are you having pain? No  SOZO SCREENING: Patient was assessed today using the SOZO machine to determine the lymphedema index score. This was compared to her baseline score. It was determined that she is within the recommended range when compared to her baseline and no further action is needed at this time. She will continue SOZO screenings. These are done every 3 months for 2 years post operatively followed by every 6 months for 2 years, and then annually.   L-DEX FLOWSHEETS - 11/12/22 1000       L-DEX LYMPHEDEMA SCREENING   Measurement Type Unilateral    L-DEX MEASUREMENT EXTREMITY Upper Extremity    POSITION  Standing    DOMINANT SIDE Right    At Risk Side Left    BASELINE SCORE (UNILATERAL) -0.6    L-DEX SCORE (UNILATERAL) 0.7    VALUE CHANGE (UNILAT) 1.3              Otelia Limes, PTA 11/12/2022, 10:38 AM

## 2022-11-15 ENCOUNTER — Other Ambulatory Visit: Payer: Self-pay | Admitting: Nurse Practitioner

## 2022-11-27 ENCOUNTER — Ambulatory Visit: Payer: BC Managed Care – PPO | Admitting: Obstetrics and Gynecology

## 2022-11-28 ENCOUNTER — Ambulatory Visit: Payer: BC Managed Care – PPO | Admitting: Obstetrics and Gynecology

## 2022-11-28 ENCOUNTER — Encounter: Payer: Self-pay | Admitting: Obstetrics and Gynecology

## 2022-11-28 ENCOUNTER — Other Ambulatory Visit: Payer: Self-pay

## 2022-11-28 ENCOUNTER — Telehealth: Payer: Self-pay

## 2022-11-28 VITALS — BP 110/62 | HR 74 | Wt 185.0 lb

## 2022-11-28 DIAGNOSIS — N760 Acute vaginitis: Secondary | ICD-10-CM | POA: Diagnosis not present

## 2022-11-28 DIAGNOSIS — B3731 Acute candidiasis of vulva and vagina: Secondary | ICD-10-CM | POA: Diagnosis not present

## 2022-11-28 LAB — WET PREP FOR TRICH, YEAST, CLUE

## 2022-11-28 MED ORDER — TERCONAZOLE 0.4 % VA CREA: 1.0000 | TOPICAL_CREAM | Freq: Every day | VAGINAL | 0 refills | Status: DC

## 2022-11-28 MED ORDER — CLOBETASOL PROPIONATE 0.05 % EX OINT: 1.0000 | TOPICAL_OINTMENT | Freq: Two times a day (BID) | CUTANEOUS | 0 refills | Status: AC

## 2022-11-28 NOTE — Progress Notes (Signed)
tGYNECOLOGY  VISIT   HPI: 52 y.o.   Married White or Caucasian Not Hispanic or Latino  female   [phone] with No LMP recorded. (Menstrual status: Perimenopausal).   here for vaginal itching and burning for the last 3 days. She has had an increase in a clear discharge. No odor.   GYNECOLOGIC HISTORY: No LMP recorded. (Menstrual status: Perimenopausal). Contraception:condoms  Menopausal hormone therapy: none         OB History     Gravida  3   Para  3   Term  3   Preterm      AB      Living  3      SAB      IAB      Ectopic      Multiple      Live Births  3              Patient Active Problem List   Diagnosis Date Noted   Family history of prostate cancer    Family history of colon cancer    Family history of pancreatic cancer    Malignant neoplasm of upper-outer quadrant of left breast in female, estrogen receptor positive ([place]) 01/10/2021    Past Medical History:  Diagnosis Date   Cancer ([place]) 01/2021   left breast IDC in situ   Complication of anesthesia    Family history of adverse reaction to anesthesia    Family history of colon cancer    Family history of pancreatic cancer    Family history of prostate cancer    Grover's disease     Past Surgical History:  Procedure Laterality Date   BREAST LUMPECTOMY WITH RADIOACTIVE SEED AND SENTINEL LYMPH NODE BIOPSY Left 02/10/2021   Procedure: LEFT BREAST LUMPECTOMY WITH RADIOACTIVE SEED AND SENTINEL LYMPH NODE BIOPSY;  Surgeon: Coralie Keens, MD;  Location: [facility];  Service: General;  Laterality: Left;   CESAREAN SECTION     x3    Current Outpatient Medications  Medication Sig Dispense Refill   ibuprofen (ADVIL) 200 MG tablet Take 200 mg by mouth every 6 (six) hours as needed.     Multiple Vitamins-Minerals (AIRBORNE GUMMIES PO) Take by mouth.     tamoxifen (NOLVADEX) 20 MG tablet Take 1 tablet by mouth once daily 90 tablet 0   No current facility-administered medications for  this visit.     ALLERGIES: Methotrexate derivatives and Diflucan [fluconazole]  Family History  Problem Relation Age of Onset   Prostate cancer Father 72   Myelodysplastic syndrome Father 76   Prostate cancer Paternal Uncle    Colon cancer Maternal Uncle        dx 40s   Cancer Paternal Grandfather        mouth or throat, dx 70s/80s, smoker   Pancreatic cancer Maternal Uncle        dx 42s    Social History   Socioeconomic History   Marital status: Married    Spouse name: Not on file   Number of children: Not on file   Years of education: Not on file   Highest education level: Not on file  Occupational History   Not on file  Tobacco Use   Smoking status: Former    Packs/day: 0.50    Years: 5.00    Additional pack years: 0.00    Total pack years: 2.50    Types: Cigarettes    Quit date: 01/11/1994    Years since quitting: 28.8  Smokeless tobacco: Never  Substance and Sexual Activity   Alcohol use: Not Currently   Drug use: Never   Sexual activity: Yes    Birth control/protection: Condom  Other Topics Concern   Not on file  Social History Narrative   Not on file   Social Determinants of Health   Financial Resource Strain: Not on file  Food Insecurity: Not on file  Transportation Needs: Not on file  Physical Activity: Not on file  Stress: Not on file  Social Connections: Not on file  Intimate Partner Violence: Not on file    ROS  PHYSICAL EXAMINATION:    BP 110/62   Pulse 74   Wt 185 lb (83.9 kg)   SpO2 100%   BMI 28.98 kg/m     General appearance: alert, cooperative and appears stated age   Pelvic: External genitalia:  no lesions, + erythema              Urethra:  normal appearing urethra with no masses, tenderness or lesions              Bartholins and Skenes: normal                 Vagina: normal appearing vagina with a slight increase in thick white vaginal discharge.              Cervix: no lesions              Chaperone was present for  exam.  1. Acute vaginitis - WET PREP FOR TRICH, YEAST, CLUE - clobetasol ointment (TEMOVATE) 0.05 %; Apply 1 Application topically 2 (two) times daily. Apply as directed twice daily  Dispense: 60 g; Refill: 0  2. Yeast vaginitis - terconazole (TERAZOL 7) 0.4 % vaginal cream; Place 1 applicator vaginally at bedtime. One applicator full QHS for seven days of therapy  Dispense: 45 g; Refill: 0

## 2022-11-28 NOTE — Patient Instructions (Signed)

## 2022-11-28 NOTE — Telephone Encounter (Signed)
Per DPR access note on file I left detailed message in her voice mail with what the pharmacist had said and that Dr. Lenna Sciara had sent Rx for Terazol and reach out to [place] if she has any issues with it. Call if any questions and left triage ph #.

## 2022-11-28 NOTE — Telephone Encounter (Signed)
Dr. Talbert Nan asked me to check with patient's pharmacy to see what is recommended since she has reported reaction to Fluconazole.  I spoke with her pharmacist at [facility] who said she might be fine with Terazol. Sometimes it can be just an allergy to a dye in the tablet, etc.  They said no way to know if she will react to all "azoles" without trying it.  No other suggestions.  Dr. Talbert Nan was notified.

## 2023-01-03 ENCOUNTER — Ambulatory Visit
Admission: RE | Admit: 2023-01-03 | Discharge: 2023-01-03 | Disposition: A | Discharge status: Home / Self Care | Payer: BC Managed Care – PPO | Source: Ambulatory Visit | Attending: Hematology | Admitting: Hematology

## 2023-01-03 DIAGNOSIS — Z170 Estrogen receptor positive status [ER+]: Secondary | ICD-10-CM

## 2023-02-04 ENCOUNTER — Ambulatory Visit: Payer: BC Managed Care – PPO | Admitting: Obstetrics and Gynecology

## 2023-02-04 ENCOUNTER — Encounter: Payer: Self-pay | Admitting: Obstetrics and Gynecology

## 2023-02-04 VITALS — BP 98/60 | HR 79 | Resp 20

## 2023-02-04 DIAGNOSIS — N760 Acute vaginitis: Secondary | ICD-10-CM

## 2023-02-04 LAB — WET PREP FOR TRICH, YEAST, CLUE

## 2023-02-04 MED ORDER — TERCONAZOLE 0.8 % VA CREA: 1.0000 | TOPICAL_CREAM | Freq: Every day | VAGINAL | 1 refills | Status: AC

## 2023-02-04 NOTE — Patient Instructions (Signed)

## 2023-02-04 NOTE — Progress Notes (Signed)
GYNECOLOGY  VISIT   HPI: 52 y.o.   Married  Caucasian  female   [phone] with Patient's last menstrual period was 10/01/2022 (approximate).   here for vaginal discharge, itching and burning, started  02/01/23. Has used OTC probiotic, eaten yogurt, Vagisil and boric acid vaginal wash.  Feels irritation externally.   Thinks she a reaction to Monistat in the past.  She had numbness in her lip after taking Diflucan.  Treated with benadryl.   This is the third time since January she has had vaginitis.  Had yeast infection in January and in March.   Hx breast cancer, on Tamoxifen since 2022.   No recent abx.   New detergent, which has now run out.   No steroids use.   FH DM.   Son is graduating.   She is going to the beach this summer and concerned about having a yeast infection while on vacation.   GYNECOLOGIC HISTORY: Patient's last menstrual period was 10/01/2022 (approximate). Contraception:  condoms Menopausal hormone therapy:  None Last mammogram:  Bilateral Dx MMG 01/03/23 -BiRads 2, benign Last pap smear:   10/16/22 normal, neg hpv        OB History     Gravida  3   Para  3   Term  3   Preterm      AB      Living  3      SAB      IAB      Ectopic      Multiple      Live Births  3              Patient Active Problem List   Diagnosis Date Noted   Family history of prostate cancer    Family history of colon cancer    Family history of pancreatic cancer    Malignant neoplasm of upper-outer quadrant of left breast in female, estrogen receptor positive ([facility]) 01/10/2021    Past Medical History:  Diagnosis Date   Cancer ([facility]) 01/2021   left breast IDC in situ   Complication of anesthesia    Family history of adverse reaction to anesthesia    Family history of colon cancer    Family history of pancreatic cancer    Family history of prostate cancer    Grover's disease     Past Surgical History:  Procedure Laterality Date   BREAST LUMPECTOMY  WITH RADIOACTIVE SEED AND SENTINEL LYMPH NODE BIOPSY Left 02/10/2021   Procedure: LEFT BREAST LUMPECTOMY WITH RADIOACTIVE SEED AND SENTINEL LYMPH NODE BIOPSY;  Surgeon: Abigail Miyamoto, MD;  Location: [facility];  Service: General;  Laterality: Left;   CESAREAN SECTION     x3    Current Outpatient Medications  Medication Sig Dispense Refill   clobetasol ointment (TEMOVATE) 0.05 % Apply 1 Application topically 2 (two) times daily. Apply as directed twice daily 60 g 0   ibuprofen (ADVIL) 200 MG tablet Take 200 mg by mouth every 6 (six) hours as needed.     Multiple Vitamins-Minerals (AIRBORNE GUMMIES PO) Take by mouth.     tamoxifen (NOLVADEX) 20 MG tablet Take 1 tablet by mouth once daily 90 tablet 0   No current facility-administered medications for this visit.     ALLERGIES: Methotrexate derivatives and Diflucan [fluconazole]  Family History  Problem Relation Age of Onset   Prostate cancer Father 26   Myelodysplastic syndrome Father 10   Prostate cancer Paternal Uncle    Colon cancer  Maternal Uncle        dx 58s   Cancer Paternal Grandfather        mouth or throat, dx 70s/80s, smoker   Pancreatic cancer Maternal Uncle        dx 65s    Social History   Socioeconomic History   Marital status: Married    Spouse name: Not on file   Number of children: Not on file   Years of education: Not on file   Highest education level: Not on file  Occupational History   Not on file  Tobacco Use   Smoking status: Former    Packs/day: 0.50    Years: 5.00    Additional pack years: 0.00    Total pack years: 2.50    Types: Cigarettes    Quit date: 01/11/1994    Years since quitting: 29.0   Smokeless tobacco: Never  Substance and Sexual Activity   Alcohol use: Not Currently   Drug use: Never   Sexual activity: Yes    Birth control/protection: Condom  Other Topics Concern   Not on file  Social History Narrative   Not on file   Social Determinants of Health    Financial Resource Strain: Not on file  Food Insecurity: Not on file  Transportation Needs: Not on file  Physical Activity: Not on file  Stress: Not on file  Social Connections: Not on file  Intimate Partner Violence: Not on file    Review of Systems  Genitourinary:  Positive for vaginal discharge.       Vaginal itching    PHYSICAL EXAMINATION:    BP 98/60 (BP Location: Right Arm, Patient Position: Sitting)   Pulse 79   Resp 20   LMP 10/01/2022 (Approximate)   SpO2 98%     General appearance: alert, cooperative and appears stated age   Pelvic: External genitalia:  erythema of the vulva.               Urethra:  normal appearing urethra with no masses, tenderness or lesions              Bartholins and Skenes: normal                 Vagina: normal appearing vagina with normal color and clumpy white discharge, no lesions              Cervix: no lesions                Bimanual Exam:  Uterus:  normal size, contour, position, consistency, mobility, non-tender              Adnexa: no mass, fullness, tenderness         Chaperone was present for exam:  Chapman Fitch, RN  ASSESSMENT  Vulvovaginitis.  Recurrent yeast infection.  Allergy to Diflucan.   Hx left breast cancer.  On Tamoxifen.   PLAN  Wet prep:  positive yeast, negative clue cells, negative trichomonas.  Rx for Terazol 3 cream with one refill.  Will check A1C today.  Risk factors for yeast infection reviewed.  FU prn.    30 min  total time was spent for this patient encounter, including preparation, face-to-face counseling with the patient, coordination of care, and documentation of the encounter.

## 2023-02-05 LAB — HEMOGLOBIN A1C
Hgb A1c MFr Bld: 5 % of total Hgb (ref <5.7)
Mean Plasma Glucose: 97 mg/dL
eAG (mmol/L): 5.4 mmol/L

## 2023-02-25 ENCOUNTER — Ambulatory Visit: Payer: BC Managed Care – PPO | Attending: Surgery

## 2023-02-25 VITALS — Wt 182.0 lb

## 2023-02-25 DIAGNOSIS — Z483 Aftercare following surgery for neoplasm: Secondary | ICD-10-CM | POA: Insufficient documentation

## 2023-02-25 NOTE — Therapy (Signed)
  OUTPATIENT PHYSICAL THERAPY SOZO SCREENING NOTE   Patient Name: Brandi Phelps MRN: [id number] DOB:11/05/1970, 52 y.o., female Today's Date: 02/25/2023  PCP: Tacey Ruiz, FNP REFERRING PROVIDER: Abigail Miyamoto, MD   PT End of Session - 02/25/23 1031     Visit Number 5   # unchanged due to screen only   PT Start Time 1029    PT Stop Time 1033    PT Time Calculation (min) 4 min    Activity Tolerance Patient tolerated treatment well    Behavior During Therapy [facility] for tasks assessed/performed             Past Medical History:  Diagnosis Date   Cancer ([facility]) 01/2021   left breast IDC in situ   Complication of anesthesia    Family history of adverse reaction to anesthesia    Family history of colon cancer    Family history of pancreatic cancer    Family history of prostate cancer    Grover's disease    Past Surgical History:  Procedure Laterality Date   BREAST LUMPECTOMY WITH RADIOACTIVE SEED AND SENTINEL LYMPH NODE BIOPSY Left 02/10/2021   Procedure: LEFT BREAST LUMPECTOMY WITH RADIOACTIVE SEED AND SENTINEL LYMPH NODE BIOPSY;  Surgeon: Abigail Miyamoto, MD;  Location: [facility];  Service: General;  Laterality: Left;   CESAREAN SECTION     x3   Patient Active Problem List   Diagnosis Date Noted   Family history of prostate cancer    Family history of colon cancer    Family history of pancreatic cancer    Malignant neoplasm of upper-outer quadrant of left breast in female, estrogen receptor positive ([facility]) 01/10/2021    REFERRING DIAG: left breast cancer at risk for lymphedema  THERAPY DIAG: Aftercare following surgery for neoplasm  PERTINENT HISTORY: Patient was diagnosed on 12/19/2020 with 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 She underwent a left lumpcetomy and sentinel node biopsy (3 negative nodes) on 02/10/2021. It is ER/PR positive and HER2 negative with a Ki67 of 10%. Radiation completed.  Taking tamoxifen    PRECAUTIONS: left UE Lymphedema risk, None  SUBJECTIVE: Pt returns for her last 3 month L-Dex screen.   PAIN:  Are you having pain? No  SOZO SCREENING: Patient was assessed today using the SOZO machine to determine the lymphedema index score. This was compared to her baseline score. It was determined that she is within the recommended range when compared to her baseline and no further action is needed at this time. She will continue SOZO screenings. These are done every 3 months for 2 years post operatively followed by every 6 months for 2 years, and then annually.   L-DEX FLOWSHEETS - 02/25/23 1000       L-DEX LYMPHEDEMA SCREENING   Measurement Type Unilateral    L-DEX MEASUREMENT EXTREMITY Upper Extremity    POSITION  Standing    DOMINANT SIDE Right    At Risk Side Left    BASELINE SCORE (UNILATERAL) -0.6    L-DEX SCORE (UNILATERAL) -3.3    VALUE CHANGE (UNILAT) -2.7             P: Begin 6 month L-Dex screens next.   Hermenia Bers, PTA 02/25/2023, 10:33 AM

## 2023-04-12 ENCOUNTER — Inpatient Hospital Stay: Payer: BC Managed Care – PPO | Attending: Adult Health

## 2023-04-12 ENCOUNTER — Encounter: Payer: Self-pay | Admitting: Adult Health

## 2023-04-12 ENCOUNTER — Other Ambulatory Visit: Payer: Self-pay

## 2023-04-12 ENCOUNTER — Inpatient Hospital Stay (HOSPITAL_BASED_OUTPATIENT_CLINIC_OR_DEPARTMENT_OTHER): Payer: BC Managed Care – PPO | Admitting: Adult Health

## 2023-04-12 ENCOUNTER — Ambulatory Visit (HOSPITAL_COMMUNITY)
Admission: RE | Admit: 2023-04-12 | Discharge: 2023-04-12 | Disposition: A | Discharge status: Home / Self Care | Payer: BC Managed Care – PPO | Source: Ambulatory Visit | Attending: Adult Health | Admitting: Adult Health

## 2023-04-12 VITALS — BP 105/58 | HR 77 | Temp 97.7°F | Resp 18 | Ht 67.0 in | Wt 187.6 lb

## 2023-04-12 DIAGNOSIS — R5383 Other fatigue: Secondary | ICD-10-CM | POA: Insufficient documentation

## 2023-04-12 DIAGNOSIS — Z79899 Other long term (current) drug therapy: Secondary | ICD-10-CM | POA: Insufficient documentation

## 2023-04-12 DIAGNOSIS — M542 Cervicalgia: Secondary | ICD-10-CM

## 2023-04-12 DIAGNOSIS — Z170 Estrogen receptor positive status [ER+]: Secondary | ICD-10-CM | POA: Insufficient documentation

## 2023-04-12 DIAGNOSIS — Z87891 Personal history of nicotine dependence: Secondary | ICD-10-CM | POA: Insufficient documentation

## 2023-04-12 DIAGNOSIS — Z8042 Family history of malignant neoplasm of prostate: Secondary | ICD-10-CM | POA: Diagnosis not present

## 2023-04-12 DIAGNOSIS — C50412 Malignant neoplasm of upper-outer quadrant of left female breast: Secondary | ICD-10-CM | POA: Insufficient documentation

## 2023-04-12 DIAGNOSIS — R252 Cramp and spasm: Secondary | ICD-10-CM | POA: Insufficient documentation

## 2023-04-12 DIAGNOSIS — Z800 Family history of malignant neoplasm of digestive organs: Secondary | ICD-10-CM | POA: Diagnosis not present

## 2023-04-12 DIAGNOSIS — Z79810 Long term (current) use of selective estrogen receptor modulators (SERMs): Secondary | ICD-10-CM | POA: Diagnosis not present

## 2023-04-12 LAB — CBC WITH DIFFERENTIAL (CANCER CENTER ONLY)
Abs Immature Granulocytes: 0.01 10*3/uL (ref 0.00–0.07)
Basophils Absolute: 0.1 10*3/uL (ref 0.0–0.1)
Basophils Relative: 1 %
Eosinophils Absolute: 0.3 10*3/uL (ref 0.0–0.5)
Eosinophils Relative: 6 %
HCT: 39.5 % (ref 36.0–46.0)
Hemoglobin: 13.1 g/dL (ref 12.0–15.0)
Immature Granulocytes: 0 %
Lymphocytes Relative: 29 %
Lymphs Abs: 1.4 10*3/uL (ref 0.7–4.0)
MCH: 30.5 pg (ref 26.0–34.0)
MCHC: 33.2 g/dL (ref 30.0–36.0)
MCV: 91.9 fL (ref 80.0–100.0)
Monocytes Absolute: 0.4 10*3/uL (ref 0.1–1.0)
Monocytes Relative: 9 %
Neutro Abs: 2.6 10*3/uL (ref 1.7–7.7)
Neutrophils Relative %: 55 %
Platelet Count: 228 10*3/uL (ref 150–400)
RBC: 4.3 MIL/uL (ref 3.87–5.11)
RDW: 12 % (ref 11.5–15.5)
WBC Count: 4.8 10*3/uL (ref 4.0–10.5)
nRBC: 0 % (ref 0.0–0.2)

## 2023-04-12 LAB — CMP (CANCER CENTER ONLY)
ALT: 9 U/L (ref 0–44)
AST: 13 U/L — ABNORMAL LOW (ref 15–41)
Albumin: 4.3 g/dL (ref 3.5–5.0)
Alkaline Phosphatase: 57 U/L (ref 38–126)
Anion gap: 5 (ref 5–15)
BUN: 14 mg/dL (ref 6–20)
CO2: 31 mmol/L (ref 22–32)
Calcium: 9.2 mg/dL (ref 8.9–10.3)
Chloride: 107 mmol/L (ref 98–111)
Creatinine: 0.65 mg/dL (ref 0.44–1.00)
GFR, Estimated: >60 mL/min (ref >60)
Glucose, Bld: 73 mg/dL (ref 70–99)
Potassium: 4.2 mmol/L (ref 3.5–5.1)
Sodium: 143 mmol/L (ref 135–145)
Total Bilirubin: 0.3 mg/dL (ref 0.3–1.2)
Total Protein: 6.8 g/dL (ref 6.5–8.1)

## 2023-04-12 NOTE — Progress Notes (Signed)
[facility] Cancer Follow up:    Brandi Ruiz, FNP [address] [place] [address]   DIAGNOSIS:  Cancer Staging  Malignant neoplasm of upper-outer quadrant of left breast in female, estrogen receptor positive (HCC) Staging form: Breast, AJCC 8th Edition - Clinical stage from 01/11/2021: Stage IA (cT1b, cN0, cM0, G2, ER+, PR+, HER2-) - Signed by Malachy Mood, MD on 01/11/2021 Stage prefix: Initial diagnosis Histologic grading system: 3 grade system Laterality: Left Staged by: Pathologist and managing physician Stage used in treatment planning: Yes National guidelines used in treatment planning: Yes Type of national guideline used in treatment planning: NCCN - Pathologic stage from 02/10/2021: Stage IA (pT1b, pN0, cM0, G2, ER+, PR+, HER2-) - Signed by Malachy Mood, MD on 04/12/2021 Stage prefix: Initial diagnosis Histologic grading system: 3 grade system   SUMMARY OF ONCOLOGIC HISTORY: Oncology History Overview Note  Cancer Staging Malignant neoplasm of upper-outer quadrant of left breast in female, estrogen receptor positive (HCC) Staging form: Breast, AJCC 8th Edition - Clinical stage from 01/11/2021: Stage IA (cT1b, cN0, cM0, G2, ER+, PR+, HER2-) - Signed by Malachy Mood, MD on 01/11/2021 Stage prefix: Initial diagnosis Histologic grading system: 3 grade system Laterality: Left Staged by: Pathologist and managing physician Stage used in treatment planning: Yes National guidelines used in treatment planning: Yes Type of national guideline used in treatment planning: NCCN - Pathologic stage from 02/10/2021: Stage IA (pT1b, pN0, cM0, G2, ER+, PR+, HER2-) - Signed by Malachy Mood, MD on 04/12/2021 Stage prefix: Initial diagnosis Histologic grading system: 3 grade system    Malignant neoplasm of upper-outer quadrant of left breast in female, estrogen receptor positive (HCC)  01/05/2021 Initial Biopsy   Diagnosis Breast, left, needle core biopsy, upper  outer - INVASIVE DUCTAL CARCINOMA, GRADE 1/2. - DUCTAL CARCINOMA IN SITU. Microscopic Comment The greatest tumor dimension is 0.8 cm. A breast prognostic profile will be performed. Dr. Berneice Heinrich agrees.   01/05/2021 Receptors her2   PROGNOSTIC INDICATORS Results: IMMUNOHISTOCHEMICAL AND MORPHOMETRIC ANALYSIS PERFORMED MANUALLY The tumor cells are NEGATIVE for Her2 (1+). Estrogen Receptor: 85%, POSITIVE, MODERATE STAINING INTENSITY Progesterone Receptor: 95%, POSITIVE, STRONG STAINING INTENSITY Proliferation Marker Ki67: 10%   01/05/2021 Mammogram   Mammogram  Left breast mass 1:30 position   01/10/2021 Initial Diagnosis   Malignant neoplasm of upper-outer quadrant of left breast in female, estrogen receptor positive (HCC)   01/11/2021 Cancer Staging   Staging form: Breast, AJCC 8th Edition - Clinical stage from 01/11/2021: Stage IA (cT1b, cN0, cM0, G2, ER+, PR+, HER2-) - Signed by Malachy Mood, MD on 01/11/2021 Stage prefix: Initial diagnosis Histologic grading system: 3 grade system Laterality: Left Staged by: Pathologist and managing physician Stage used in treatment planning: Yes National guidelines used in treatment planning: Yes Type of national guideline used in treatment planning: NCCN   02/10/2021 Cancer Staging   Staging form: Breast, AJCC 8th Edition - Pathologic stage from 02/10/2021: Stage IA (pT1b, pN0, cM0, G2, ER+, PR+, HER2-) - Signed by Malachy Mood, MD on 04/12/2021 Stage prefix: Initial diagnosis Histologic grading system: 3 grade system   02/10/2021 Pathology Results   FINAL MICROSCOPIC DIAGNOSIS:   A. 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 BREAST, LEFT NEW MEDIAL/INFERIOR MARGIN,  EXCISION:  -  No residual carcinoma identified   C.  LYMPH NODE, LEFT AXILLARY, SENTINEL, EXCISION:  -  No carcinoma identified in one lymph node (0/1)   D. LYMPH NODE, LEFT AXILLARY, SENTINEL, EXCISION:  -  No carcinoma identified in one lymph node (0/1)   E. LYMPH NODE, LEFT AXILLARY, SENTINEL, EXCISION:  -  No carcinoma identified in one lymph node (0/1)    02/10/2021 Oncotype testing   Recurrence score of 6, risk of distant metastasis 3%, no benefit from chemo   03/27/2021 - 04/24/2021 Radiation Therapy   Under Dr. Mitzi Hansen     CURRENT THERAPY:Tamoxifen  INTERVAL HISTORY: Brandi Phelps 52 y.o. female returns for follow-up of her history of breast cancer.  Her most recent diagnostic bilateral breast mammogram occurred on Jan 03, 2023 demonstrating no mammographic evidence of malignancy and breast density category C.  She continues on tamoxifen daily with good tolerance.  She has some cramping in her hips and neck and wonder if it could be related to the tamoxifen.  She says the pain when extending her nec or moving it side to side sounds like bone on bone and she notes discomfort x the past 2 weeks.  She is not having any focal arm weakness or numbness in her arms.   Patient Active Problem List   Diagnosis Date Noted   Family history of prostate cancer    Family history of colon cancer    Family history of pancreatic cancer    Malignant neoplasm of upper-outer quadrant of left breast in female, estrogen receptor positive (HCC) 01/10/2021    is allergic to methotrexate derivatives and diflucan [fluconazole].  MEDICAL HISTORY: Past Medical History:  Diagnosis Date   Cancer (HCC) 01/2021   left breast IDC in situ   Complication of anesthesia    Family history of adverse reaction to anesthesia    Family history of colon cancer    Family history of pancreatic cancer    Family history of prostate cancer    Grover's disease     SURGICAL HISTORY: Past Surgical History:  Procedure Laterality Date   BREAST LUMPECTOMY WITH  RADIOACTIVE SEED AND SENTINEL LYMPH NODE BIOPSY Left 02/10/2021   Procedure: LEFT BREAST LUMPECTOMY WITH RADIOACTIVE SEED AND SENTINEL LYMPH NODE BIOPSY;  Surgeon: Abigail Miyamoto, MD;  Location: [facility];  Service: General;  Laterality: Left;   CESAREAN SECTION     x3    SOCIAL HISTORY: Social History   Socioeconomic History   Marital status: Married    Spouse name: Not on file   Number of children: Not on file   Years of education: Not on file   Highest education level: Not on file  Occupational History   Not on file  Tobacco Use   Smoking status: Former    Current packs/day: 0.00    Average packs/day: 0.5 packs/day for 5.0 years (2.5 ttl pk-yrs)    Types: Cigarettes    Start date: 01/11/1989    Quit date: 01/11/1994    Years since quitting: 29.2   Smokeless tobacco: Never  Substance and Sexual Activity   Alcohol use: Not Currently   Drug use: Never   Sexual activity: Yes    Birth control/protection: Condom  Other Topics Concern   Not on file  Social History Narrative   Not on file   Social Determinants of Health   Financial Resource Strain: Not on file  Food Insecurity: Not on file  Transportation Needs: Not on  file  Physical Activity: Not on file  Stress: Not on file  Social Connections: Not on file  Intimate Partner Violence: Not on file    FAMILY HISTORY: Family History  Problem Relation Age of Onset   Prostate cancer Father 25   Myelodysplastic syndrome Father 42   Prostate cancer Paternal Uncle    Colon cancer Maternal Uncle        dx 15s   Cancer Paternal Grandfather        mouth or throat, dx 70s/80s, smoker   Pancreatic cancer Maternal Uncle        dx 29s    Review of Systems  Constitutional:  Positive for fatigue. Negative for appetite change, chills, fever and unexpected weight change.  HENT:   Negative for hearing loss, lump/mass and trouble swallowing.   Eyes:  Negative for eye problems and icterus.  Respiratory:   Negative for chest tightness, cough and shortness of breath.   Cardiovascular:  Negative for chest pain, leg swelling and palpitations.  Gastrointestinal:  Negative for abdominal distention, abdominal pain, constipation, diarrhea, nausea and vomiting.  Endocrine: Negative for hot flashes.  Genitourinary:  Negative for difficulty urinating.   Musculoskeletal:  Negative for arthralgias.  Skin:  Negative for itching and rash.  Neurological:  Negative for dizziness, extremity weakness, headaches and numbness.  Hematological:  Negative for adenopathy. Does not bruise/bleed easily.  Psychiatric/Behavioral:  Negative for depression. The patient is not nervous/anxious.       PHYSICAL EXAMINATION    Vitals:   04/12/23 1110  BP: (!) 105/58  Pulse: 77  Resp: 18  Temp: 97.7 F (36.5 C)  SpO2: 98%    Physical Exam Constitutional:      General: She is not in acute distress.    Appearance: Normal appearance. She is not toxic-appearing.  HENT:     Head: Normocephalic and atraumatic.     Mouth/Throat:     Mouth: Mucous membranes are moist.     Pharynx: Oropharynx is clear. No oropharyngeal exudate or posterior oropharyngeal erythema.  Eyes:     General: No scleral icterus. Cardiovascular:     Rate and Rhythm: Normal rate and regular rhythm.     Pulses: Normal pulses.     Heart sounds: Normal heart sounds.  Pulmonary:     Effort: Pulmonary effort is normal.     Breath sounds: Normal breath sounds.  Chest:     Comments: Left breast status postlumpectomy and radiation no sign of local recurrence right breast is benign. Abdominal:     General: Abdomen is flat. Bowel sounds are normal. There is no distension.     Palpations: Abdomen is soft.     Tenderness: There is no abdominal tenderness.  Musculoskeletal:        General: No swelling.     Cervical back: Neck supple.  Lymphadenopathy:     Cervical: No cervical adenopathy.  Skin:    General: Skin is warm and dry.     Findings: No  rash.  Neurological:     General: No focal deficit present.     Mental Status: She is alert.  Psychiatric:        Mood and Affect: Mood normal.        Behavior: Behavior normal.    LABORATORY DATA:  CBC    Component Value Date/Time   WBC 4.8 04/12/2023 1101   RBC 4.30 04/12/2023 1101   HGB 13.1 04/12/2023 1101   HCT 39.5 04/12/2023 1101   PLT 228  04/12/2023 1101   MCV 91.9 04/12/2023 1101   MCH 30.5 04/12/2023 1101   MCHC 33.2 04/12/2023 1101   RDW 12.0 04/12/2023 1101   LYMPHSABS 1.4 04/12/2023 1101   MONOABS 0.4 04/12/2023 1101   EOSABS 0.3 04/12/2023 1101   BASOSABS 0.1 04/12/2023 1101    CMP     Component Value Date/Time   NA 143 04/12/2023 1101   K 4.2 04/12/2023 1101   CL 107 04/12/2023 1101   CO2 31 04/12/2023 1101   GLUCOSE 73 04/12/2023 1101   BUN 14 04/12/2023 1101   CREATININE 0.65 04/12/2023 1101   CALCIUM 9.2 04/12/2023 1101   PROT 6.8 04/12/2023 1101   ALBUMIN 4.3 04/12/2023 1101   AST 13 (L) 04/12/2023 1101   ALT 9 04/12/2023 1101   ALKPHOS 57 04/12/2023 1101   BILITOT 0.3 04/12/2023 1101   GFRNONAA >60 04/12/2023 1101    ASSESSMENT and THERAPY PLAN:   Malignant neoplasm of upper-outer quadrant of left breast in female, estrogen receptor positive (HCC) Brandi Phelps is a 52 year old woman with history of stage Ia ER/PR positive left breast invasive ductal carcinoma diagnosed in May 2022 status postlumpectomy, adjuvant radiation, and continues on tamoxifen daily.  Stage Ia left breast invasive ductal carcinoma: She has no clinical or radiographic signs of breast cancer recurrence.  She will continue on tamoxifen daily.  Her next mammogram is due in May 2025. Cramping: I suggested she drink 60-80 ounces of water daily and take a magnesium supplement in the evening Fatigue: We discussed slowly incorporating more fruits and vegetables into her diet and also decreasing her soda intake. Neck pain: Obtain plain films of the cervical and thoracic spine today to  further evaluate. She will continue to see her primary care provider regularly in order to stay up-to-date with her health maintenance.  RTC in 6 months for f/u with Dr. Mosetta Putt and in one year for f/u with me.       All questions were answered. The patient knows to call the clinic with any problems, questions or concerns. We can certainly see the patient much sooner if necessary.  Total encounter time:30 minutes*in face-to-face visit time, chart review, lab review, care coordination, order entry, and documentation of the encounter time.  Lillard Anes, NP 04/12/23 12:08 PM Medical Oncology and Hematology [facility] [address] [place], [place] [address] Tel. [phone]    Fax. [phone]  *Total Encounter Time as defined by the Centers for Medicare and Medicaid Services includes, in addition to the face-to-face time of a patient visit (documented in the note above) non-face-to-face time: obtaining and reviewing outside history, ordering and reviewing medications, tests or procedures, care coordination (communications with other health care professionals or caregivers) and documentation in the medical record.

## 2023-04-12 NOTE — Assessment & Plan Note (Addendum)
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  Stage Ia left breast invasive ductal carcinoma: She has no clinical or radiographic signs of breast cancer recurrence.  She will continue on tamoxifen daily.  Her next mammogram is due in May 2025. Cramping: I suggested she drink 60-80 ounces of water daily and take a magnesium supplement in the evening Fatigue: We discussed slowly incorporating more fruits and vegetables into her diet and also decreasing her soda intake. Neck pain: Obtain plain films of the cervical and thoracic spine today to further evaluate. She will continue to see her primary care provider regularly in order to stay up-to-date with her health maintenance.  RTC in 6 months for f/u with Dr. Mosetta Putt and in one year for f/u with me.

## 2023-04-15 ENCOUNTER — Telehealth: Payer: Self-pay | Admitting: Adult Health

## 2023-04-15 NOTE — Telephone Encounter (Signed)
Scheduled appointments per 8/9 los. Patient is aware of the made appointments.

## 2023-04-16 ENCOUNTER — Telehealth: Payer: Self-pay

## 2023-04-16 NOTE — Telephone Encounter (Signed)
Pt contacted to inform her of xray results. Per Lillard Anes, NP the imaging showed "scoliosis of spine but no cancer." Patient stated understanding and explanation of scoliosis provided by this RN. No further questions asked.

## 2023-04-23 ENCOUNTER — Telehealth: Payer: Self-pay | Admitting: Adult Health

## 2023-04-23 NOTE — Telephone Encounter (Signed)
-----   Message from Noreene Filbert sent at 04/22/2023 11:33 AM EDT ----- Brandi Phelps shows degenerative disc disease.  I recommend she f/u with her PCP ----- Message ----- From: Interface, Rad Results In Sent: 04/20/2023   2:43 AM EDT To: Loa Socks, NP

## 2023-04-23 NOTE — Telephone Encounter (Signed)
Per Lindsey Causey,DNP, called pt with message below. Pt verbalized understanding 

## 2023-06-24 ENCOUNTER — Other Ambulatory Visit: Payer: Self-pay | Admitting: Hematology

## 2023-08-08 ENCOUNTER — Encounter: Payer: Self-pay | Admitting: Hematology

## 2023-08-12 ENCOUNTER — Ambulatory Visit: Payer: BC Managed Care – PPO | Attending: Surgery

## 2023-08-12 VITALS — Wt 192.4 lb

## 2023-08-12 DIAGNOSIS — Z483 Aftercare following surgery for neoplasm: Secondary | ICD-10-CM | POA: Insufficient documentation

## 2023-08-12 NOTE — Telephone Encounter (Signed)
Please follow up on this message. Lorayne Marek, RN

## 2023-08-12 NOTE — Therapy (Signed)
  OUTPATIENT PHYSICAL THERAPY SOZO SCREENING NOTE   Patient Name: Brandi Phelps MRN: [id number] DOB:02-23-1971, 52 y.o., female Today's Date: 08/12/2023  PCP: Tacey Ruiz, FNP REFERRING PROVIDER: Abigail Miyamoto, MD   PT End of Session - 08/12/23 1601     Visit Number 5   # unchanged due to screen only   PT Start Time 1558    PT Stop Time 1604    PT Time Calculation (min) 6 min    Activity Tolerance Patient tolerated treatment well    Behavior During Therapy [facility] for tasks assessed/performed             Past Medical History:  Diagnosis Date   Cancer (HCC) 01/2021   left breast IDC in situ   Complication of anesthesia    Family history of adverse reaction to anesthesia    Family history of colon cancer    Family history of pancreatic cancer    Family history of prostate cancer    Grover's disease    Past Surgical History:  Procedure Laterality Date   BREAST LUMPECTOMY WITH RADIOACTIVE SEED AND SENTINEL LYMPH NODE BIOPSY Left 02/10/2021   Procedure: LEFT BREAST LUMPECTOMY WITH RADIOACTIVE SEED AND SENTINEL LYMPH NODE BIOPSY;  Surgeon: Abigail Miyamoto, MD;  Location: [facility];  Service: General;  Laterality: Left;   CESAREAN SECTION     x3   Patient Active Problem List   Diagnosis Date Noted   Family history of prostate cancer    Family history of colon cancer    Family history of pancreatic cancer    Malignant neoplasm of upper-outer quadrant of left breast in female, estrogen receptor positive (HCC) 01/10/2021    REFERRING DIAG: left breast cancer at risk for lymphedema  THERAPY DIAG: Aftercare following surgery for neoplasm  PERTINENT HISTORY: Patient was diagnosed on 12/19/2020 with 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 She underwent a left lumpcetomy and sentinel node biopsy (3 negative nodes) on 02/10/2021. It is ER/PR positive and HER2 negative with a Ki67 of 10%. Radiation completed.  Taking tamoxifen    PRECAUTIONS: left UE Lymphedema risk, None  SUBJECTIVE: Pt returns for her first 6 month L-Dex screen.   PAIN:  Are you having pain? No  SOZO SCREENING: Patient was assessed today using the SOZO machine to determine the lymphedema index score. This was compared to her baseline score. It was determined that she is within the recommended range when compared to her baseline and no further action is needed at this time. She will continue SOZO screenings. These are done every 3 months for 2 years post operatively followed by every 6 months for 2 years, and then annually.   L-DEX FLOWSHEETS - 08/12/23 1600       L-DEX LYMPHEDEMA SCREENING   Measurement Type Unilateral    L-DEX MEASUREMENT EXTREMITY Upper Extremity    POSITION  Standing    DOMINANT SIDE Right    At Risk Side Left    BASELINE SCORE (UNILATERAL) -0.6    L-DEX SCORE (UNILATERAL) 1.6    VALUE CHANGE (UNILAT) 2.2             P: Cont 6 month L-Dex screens next.   Hermenia Bers, PTA 08/12/2023, 4:02 PM

## 2023-08-14 ENCOUNTER — Other Ambulatory Visit: Payer: Self-pay

## 2023-08-14 ENCOUNTER — Other Ambulatory Visit: Payer: Self-pay | Admitting: Nurse Practitioner

## 2023-08-14 ENCOUNTER — Telehealth: Payer: Self-pay

## 2023-08-14 DIAGNOSIS — C50412 Malignant neoplasm of upper-outer quadrant of left female breast: Secondary | ICD-10-CM

## 2023-08-14 NOTE — Telephone Encounter (Signed)
Spoke with pt via telephone to f/u on pt's MyChart message.  Pt stated she's having severe joint pain and having difficulty with bending and kneeling down.  Pt stated the pain and problem is mostly in her knees and hips.  Pt stated she's having hot flashes and headaches.  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  Pt stated she would like something other than Tamoxifen if possible.  Pt stated she's extremely fatigue and doesn't seem like she's able to get enough rest even if she's slept more than 7 hrs at night.  Recommended the pt take a prenatal vitamin w/iron and B12 to help with the fatigue the pt is experiencing.  Stated this nurse will see if Lacie or someone from Dr. Latanya Maudlin Team could do a telephone visit with the pt.  Pt is in agreement with doing a telephone visit.  Spoke with Santiago Glad, NP regarding the telephone conversation with pt.  Lacie stated she would respond to the pt's MyChart message and recommend the pt coming in for labs and doing a f/u telephone visit with Dot Lanes, NP.

## 2023-08-19 ENCOUNTER — Telehealth: Payer: Self-pay | Admitting: Nurse Practitioner

## 2023-08-20 ENCOUNTER — Inpatient Hospital Stay: Payer: BC Managed Care – PPO | Attending: Adult Health

## 2023-08-20 DIAGNOSIS — Z87891 Personal history of nicotine dependence: Secondary | ICD-10-CM | POA: Diagnosis not present

## 2023-08-20 DIAGNOSIS — Z170 Estrogen receptor positive status [ER+]: Secondary | ICD-10-CM | POA: Insufficient documentation

## 2023-08-20 DIAGNOSIS — M542 Cervicalgia: Secondary | ICD-10-CM | POA: Insufficient documentation

## 2023-08-20 DIAGNOSIS — R5383 Other fatigue: Secondary | ICD-10-CM | POA: Diagnosis not present

## 2023-08-20 DIAGNOSIS — C50412 Malignant neoplasm of upper-outer quadrant of left female breast: Secondary | ICD-10-CM | POA: Insufficient documentation

## 2023-08-20 DIAGNOSIS — Z800 Family history of malignant neoplasm of digestive organs: Secondary | ICD-10-CM | POA: Insufficient documentation

## 2023-08-20 DIAGNOSIS — Z79899 Other long term (current) drug therapy: Secondary | ICD-10-CM | POA: Diagnosis not present

## 2023-08-20 DIAGNOSIS — Z79810 Long term (current) use of selective estrogen receptor modulators (SERMs): Secondary | ICD-10-CM | POA: Insufficient documentation

## 2023-08-20 DIAGNOSIS — Z8042 Family history of malignant neoplasm of prostate: Secondary | ICD-10-CM | POA: Insufficient documentation

## 2023-08-20 DIAGNOSIS — R252 Cramp and spasm: Secondary | ICD-10-CM | POA: Insufficient documentation

## 2023-08-20 LAB — CBC WITH DIFFERENTIAL (CANCER CENTER ONLY)
Abs Immature Granulocytes: 0 10*3/uL (ref 0.00–0.07)
Basophils Absolute: 0.1 10*3/uL (ref 0.0–0.1)
Basophils Relative: 1 %
Eosinophils Absolute: 0.2 10*3/uL (ref 0.0–0.5)
Eosinophils Relative: 5 %
HCT: 40.5 % (ref 36.0–46.0)
Hemoglobin: 13.2 g/dL (ref 12.0–15.0)
Immature Granulocytes: 0 %
Lymphocytes Relative: 29 %
Lymphs Abs: 1.3 10*3/uL (ref 0.7–4.0)
MCH: 30 pg (ref 26.0–34.0)
MCHC: 32.6 g/dL (ref 30.0–36.0)
MCV: 92 fL (ref 80.0–100.0)
Monocytes Absolute: 0.4 10*3/uL (ref 0.1–1.0)
Monocytes Relative: 9 %
Neutro Abs: 2.5 10*3/uL (ref 1.7–7.7)
Neutrophils Relative %: 56 %
Platelet Count: 256 10*3/uL (ref 150–400)
RBC: 4.4 MIL/uL (ref 3.87–5.11)
RDW: 11.9 % (ref 11.5–15.5)
WBC Count: 4.5 10*3/uL (ref 4.0–10.5)
nRBC: 0 % (ref 0.0–0.2)

## 2023-08-20 LAB — CMP (CANCER CENTER ONLY)
ALT: 8 U/L (ref 0–44)
AST: 13 U/L — ABNORMAL LOW (ref 15–41)
Albumin: 4.3 g/dL (ref 3.5–5.0)
Alkaline Phosphatase: 63 U/L (ref 38–126)
Anion gap: 4 — ABNORMAL LOW (ref 5–15)
BUN: 11 mg/dL (ref 6–20)
CO2: 33 mmol/L — ABNORMAL HIGH (ref 22–32)
Calcium: 9.3 mg/dL (ref 8.9–10.3)
Chloride: 103 mmol/L (ref 98–111)
Creatinine: 0.66 mg/dL (ref 0.44–1.00)
GFR, Estimated: >60 mL/min (ref >60)
Glucose, Bld: 79 mg/dL (ref 70–99)
Potassium: 4.1 mmol/L (ref 3.5–5.1)
Sodium: 140 mmol/L (ref 135–145)
Total Bilirubin: 0.3 mg/dL (ref <1.2)
Total Protein: 6.7 g/dL (ref 6.5–8.1)

## 2023-08-20 LAB — TSH: TSH: 2.254 u[IU]/mL (ref 0.350–4.500)

## 2023-08-21 LAB — FOLLICLE STIMULATING HORMONE: FSH: 23.8 m[IU]/mL

## 2023-08-21 LAB — ESTRADIOL: Estradiol: <5 pg/mL

## 2023-08-21 NOTE — Progress Notes (Signed)
Review with patient at visit 08/23/2023.

## 2023-08-23 ENCOUNTER — Inpatient Hospital Stay (HOSPITAL_BASED_OUTPATIENT_CLINIC_OR_DEPARTMENT_OTHER): Payer: BC Managed Care – PPO | Admitting: Nurse Practitioner

## 2023-08-23 DIAGNOSIS — C50412 Malignant neoplasm of upper-outer quadrant of left female breast: Secondary | ICD-10-CM | POA: Diagnosis not present

## 2023-08-23 DIAGNOSIS — Z170 Estrogen receptor positive status [ER+]: Secondary | ICD-10-CM

## 2023-08-23 MED ORDER — LETROZOLE 2.5 MG PO TABS: 2.5000 mg | ORAL_TABLET | Freq: Every day | ORAL | 2 refills | Status: DC

## 2023-08-23 NOTE — Progress Notes (Signed)
I connected with Brandi Phelps on 09/01/23 at  3:00 PM EST by telephone and verified that I am speaking with the correct person using two identifiers.   I discussed the limitations, risks, security and privacy concerns of performing an evaluation and management service by telemedicine and the availability of in-person appointments. I also discussed with the patient that there may be a patient responsible charge related to this service. The patient expressed understanding and agreed to proceed.   Other persons participating in the visit and their role in the encounter: none   Patients location: home  Providers location: [facility]   Chief Complaint: Left breast cancer, currently on tamoxifen   Patient Care Team: Tacey Ruiz, FNP as PCP - General (Family Medicine) Abigail Miyamoto, MD as Consulting Physician (General Surgery) Malachy Mood, MD as Consulting Physician (Hematology) Dorothy Puffer, MD as Consulting Physician (Radiation Oncology) Pollyann Samples, NP as Nurse Practitioner (Nurse Practitioner)  Clinic Day:  09/01/2023  Referring physician: Tacey Ruiz, FNP  ASSESSMENT & PLAN:   Assessment & Plan: Malignant neoplasm of upper-outer quadrant of left breast in female, estrogen receptor positive (HCC) Stage IA, pT1bN0M0, stage IA, ER+/PR+/HER2-, Grade I/II  -She was found to have a 0.8cm left breast mass with biopsy confirmed invasive ductal carcinoma and components of DCIS.  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  -Oncotype DX recurrence score of 6 (low risk) -she received radiation 7/25-8/22/22 under Dr. Mitzi Hansen. -she began tamoxifen in 05/2021. She is tolerating well overall with some hot flashes. -- 08/31/2023 -symptoms getting worse, including hot flashes and joint pain.  She is also having muscle spasms, especially in her neck.  Symptoms are almost  intolerable.   Plan: Labs reviewed  -CBC showing WBC 4.5; Hgb 13.2; Hct 40.5; Plt 256; Anc 2.5 -CMP - K 4.1; glucose 79; BUN 11; Creatinine 0.66; eGFR > 60; Ca 9.3; LFTs normal.   -TSH 2.254 -Estradiol <5; FSH 23.8 Labs in patient's status indicate that she is most likely menopausal.  Will stop tamoxifen.   Reviewed alternative, aromatase inhibitors with patient, exemestane and letrozole.  Will do trial letrozole 2.5 mg daily. Labs and follow-up as scheduled on 10/10/2023.   The patient understands the plans discussed today and is in agreement with them.  She knows to contact our office if she develops concerns prior to her next appointment.  I provided 15 minutes of face-to-face time during this encounter and > 50% was spent counseling as documented under my assessment and plan.    Carlean Jews, NP  [facility] - A DEPT [facility] [address] [place] [address] Dept: [phone] Dept Fax: [phone]   No orders of the defined types were placed in this encounter.     CHIEF COMPLAINT:  CC: f/u left breast cancer - estrogen receptor positive   Current Treatment:  tamoxifen  INTERVAL HISTORY:  Brandi Phelps is here today for repeat clinical assessment.  She last saw Dr. Mosetta Putt in 04/2023.  Since then, she has developed joint pain with neck spasms.  She did have an x-ray of her cervical spine which showed scoliosis.  Primary care provider believes she is having muscle spasms, related to her scoliosis.  Primary care recommended physical therapy.  Patient reports she has been going to PT for 2 months but really not helping.  She feels  excess fatigue.  Patient was concerned that symptoms are related to taking tamoxifen.  She stopped for 2 weeks, and symptoms have nearly resolved.  To be sure, she restarted tamoxifen, and symptoms returned almost immediately.  She denies fevers or chills. She has generalized joint  pain.  There are muscle spasms in her neck which are most problematic.   I have reviewed the past medical history, past surgical history, social history and family history with the patient and they are unchanged from previous note.  ALLERGIES:  is allergic to methotrexate derivatives and diflucan [fluconazole].  MEDICATIONS:  Current Outpatient Medications  Medication Sig Dispense Refill   letrozole (FEMARA) 2.5 MG tablet Take 1 tablet (2.5 mg total) by mouth daily. 30 tablet 2   clobetasol ointment (TEMOVATE) 0.05 % Apply 1 Application topically 2 (two) times daily. Apply as directed twice daily 60 g 0   ibuprofen (ADVIL) 200 MG tablet Take 200 mg by mouth every 6 (six) hours as needed.     Multiple Vitamins-Minerals (AIRBORNE GUMMIES PO) Take by mouth.     tamoxifen (NOLVADEX) 20 MG tablet Take 1 tablet by mouth once daily 90 tablet 0   terconazole (TERAZOL 3) 0.8 % vaginal cream Place 1 applicator vaginally at bedtime. Use nightly for 3 nights. 20 g 1   No current facility-administered medications for this visit.    HISTORY OF PRESENT ILLNESS:   Oncology History Overview Note  Cancer Staging Malignant neoplasm of upper-outer quadrant of left breast in female, estrogen receptor positive (HCC) Staging form: Breast, AJCC 8th Edition - Clinical stage from 01/11/2021: Stage IA (cT1b, cN0, cM0, G2, ER+, PR+, HER2-) - Signed by Malachy Mood, MD on 01/11/2021 Stage prefix: Initial diagnosis Histologic grading system: 3 grade system Laterality: Left Staged by: Pathologist and managing physician Stage used in treatment planning: Yes National guidelines used in treatment planning: Yes Type of national guideline used in treatment planning: NCCN - Pathologic stage from 02/10/2021: Stage IA (pT1b, pN0, cM0, G2, ER+, PR+, HER2-) - Signed by Malachy Mood, MD on 04/12/2021 Stage prefix: Initial diagnosis Histologic grading system: 3 grade system    Malignant neoplasm of upper-outer quadrant of left  breast in female, estrogen receptor positive (HCC)  01/05/2021 Initial Biopsy   Diagnosis Breast, left, needle core biopsy, upper outer - INVASIVE DUCTAL CARCINOMA, GRADE 1/2. - DUCTAL CARCINOMA IN SITU. Microscopic Comment The greatest tumor dimension is 0.8 cm. A breast prognostic profile will be performed. Dr. Berneice Heinrich agrees.   01/05/2021 Receptors her2   PROGNOSTIC INDICATORS Results: IMMUNOHISTOCHEMICAL AND MORPHOMETRIC ANALYSIS PERFORMED MANUALLY The tumor cells are NEGATIVE for Her2 (1+). Estrogen Receptor: 85%, POSITIVE, MODERATE STAINING INTENSITY Progesterone Receptor: 95%, POSITIVE, STRONG STAINING INTENSITY Proliferation Marker Ki67: 10%   01/05/2021 Mammogram   Mammogram  Left breast mass 1:30 position   01/10/2021 Initial Diagnosis   Malignant neoplasm of upper-outer quadrant of left breast in female, estrogen receptor positive (HCC)   01/11/2021 Cancer Staging   Staging form: Breast, AJCC 8th Edition - Clinical stage from 01/11/2021: Stage IA (cT1b, cN0, cM0, G2, ER+, PR+, HER2-) - Signed by Malachy Mood, MD on 01/11/2021 Stage prefix: Initial diagnosis Histologic grading system: 3 grade system Laterality: Left Staged by: Pathologist and managing physician Stage used in treatment planning: Yes National guidelines used in treatment planning: Yes Type of national guideline used in treatment planning: NCCN   02/10/2021 Cancer Staging   Staging form: Breast, AJCC 8th Edition - Pathologic stage from 02/10/2021: Stage IA (pT1b, pN0, cM0, G2, ER+,  PR+, HER2-) - Signed by Malachy Mood, MD on 04/12/2021 Stage prefix: Initial diagnosis Histologic grading system: 3 grade system   02/10/2021 Pathology Results   FINAL MICROSCOPIC DIAGNOSIS:   A. BREAST, LEFT, LUMPECTOMY:  -  Invasive ductal carcinoma, Nottingham grade 2 of 3, 0.8 cm  -  Ductal carcinoma in-situ, intermediate grade  -  Margins uninvolved by carcinoma (0.4 cm; posterior margin)  -  Previous biopsy site changes present  -   See oncology table below   B. BREAST, LEFT NEW MEDIAL/INFERIOR MARGIN, EXCISION:  -  No residual carcinoma identified   C. LYMPH NODE, LEFT AXILLARY, SENTINEL, EXCISION:  -  No carcinoma identified in one lymph node (0/1)   D. LYMPH NODE, LEFT AXILLARY, SENTINEL, EXCISION:  -  No carcinoma identified in one lymph node (0/1)   E. LYMPH NODE, LEFT AXILLARY, SENTINEL, EXCISION:  -  No carcinoma identified in one lymph node (0/1)    02/10/2021 Oncotype testing   Recurrence score of 6, risk of distant metastasis 3%, no benefit from chemo   03/27/2021 - 04/24/2021 Radiation Therapy   Under Dr. Mitzi Hansen       REVIEW OF SYSTEMS:   Constitutional: Denies fevers, chills or abnormal weight loss.  Extreme fatigue. Eyes: Denies blurriness of vision Ears, nose, mouth, throat, and face: Denies mucositis or sore throat Respiratory: Denies cough, dyspnea or wheezes Cardiovascular: Denies palpitation, chest discomfort or lower extremity swelling Gastrointestinal:  Denies nausea, heartburn or change in bowel habits Skin: Denies abnormal skin rashes Lymphatics: Denies new lymphadenopathy or easy bruising Neurological:Denies numbness, tingling or new weaknesses Behavioral/Psych: Mood is stable, no new changes  All other systems were reviewed with the patient and are negative.   VITALS:  unknown if currently breastfeeding.  Wt Readings from Last 3 Encounters:  08/12/23 192 lb 6 oz (87.3 kg)  04/12/23 187 lb 9.6 oz (85.1 kg)  02/25/23 182 lb (82.6 kg)    There is no height or weight on file to calculate BMI.  Performance status (ECOG): 1 - Symptomatic but completely ambulatory    LABORATORY DATA:  I have reviewed the data as listed    Component Value Date/Time   NA 140 08/20/2023 0933   K 4.1 08/20/2023 0933   CL 103 08/20/2023 0933   CO2 33 (H) 08/20/2023 0933   GLUCOSE 79 08/20/2023 0933   BUN 11 08/20/2023 0933   CREATININE 0.66 08/20/2023 0933   CALCIUM 9.3 08/20/2023 0933    PROT 6.7 08/20/2023 0933   ALBUMIN 4.3 08/20/2023 0933   AST 13 (L) 08/20/2023 0933   ALT 8 08/20/2023 0933   ALKPHOS 63 08/20/2023 0933   BILITOT 0.3 08/20/2023 0933   GFRNONAA >60 08/20/2023 0933     Lab Results  Component Value Date   WBC 4.5 08/20/2023   NEUTROABS 2.5 08/20/2023   HGB 13.2 08/20/2023   HCT 40.5 08/20/2023   MCV 92.0 08/20/2023   PLT 256 08/20/2023

## 2023-08-28 IMAGING — MG DIGITAL DIAGNOSTIC BILAT W/ TOMO W/ CAD
8 of 13 series · 8 of 37 positions shown · non-contrast
Comparison: Previous exam(s).

CLINICAL DATA: 51-year-old female for annual follow-up. History of
LEFT breast cancer and lumpectomy in 2722.

EXAM:
DIGITAL DIAGNOSTIC BILATERAL MAMMOGRAM WITH TOMOSYNTHESIS AND CAD
TECHNIQUE: Bilateral digital diagnostic mammography and breast tomosynthesis
was performed. The images were evaluated with computer-aided
detection.

[L XCCL]
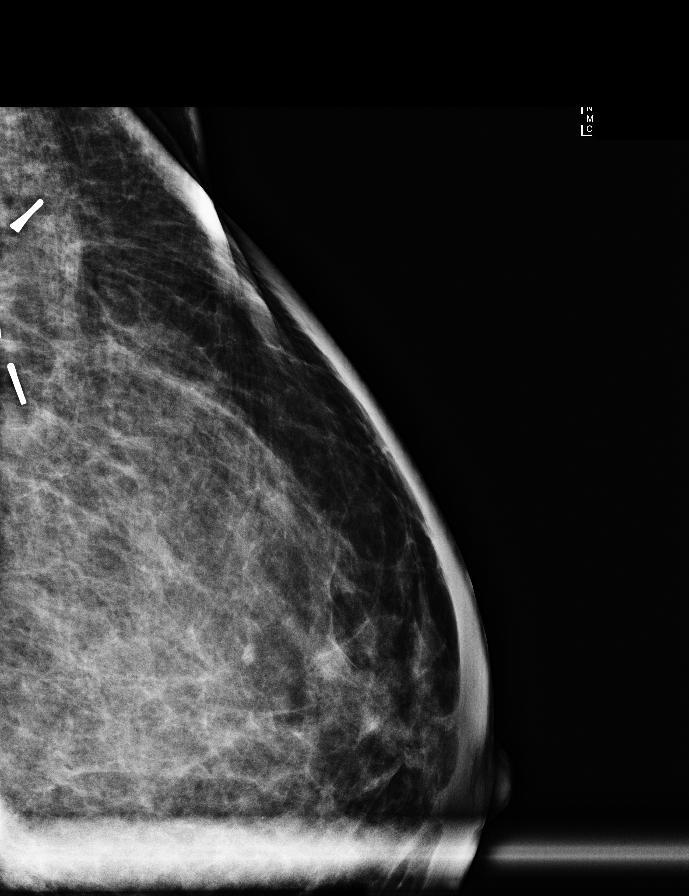

[L XCCL synth-2D]
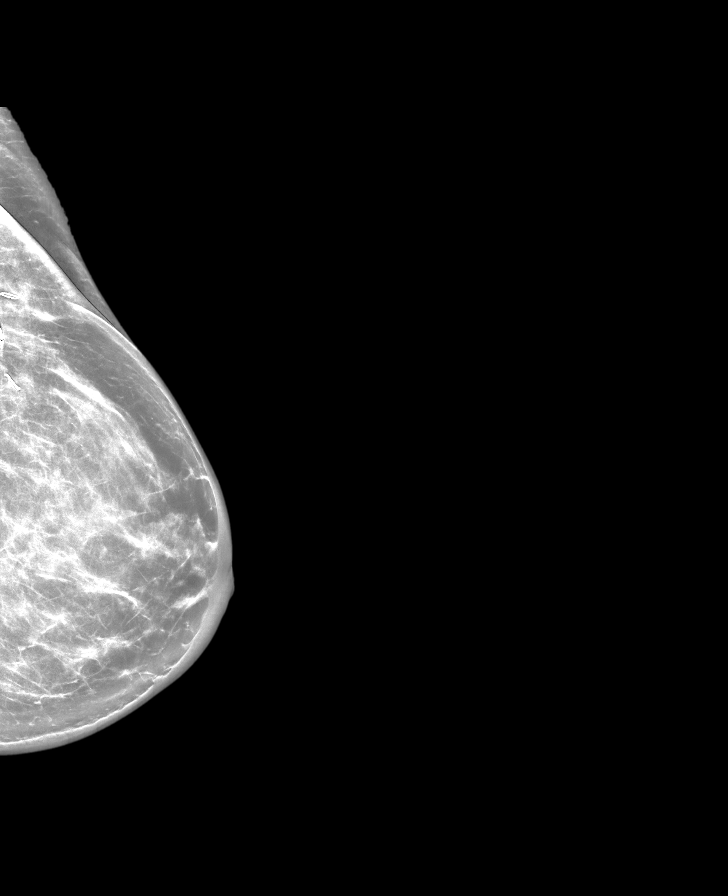

[R MLO synth-2D]
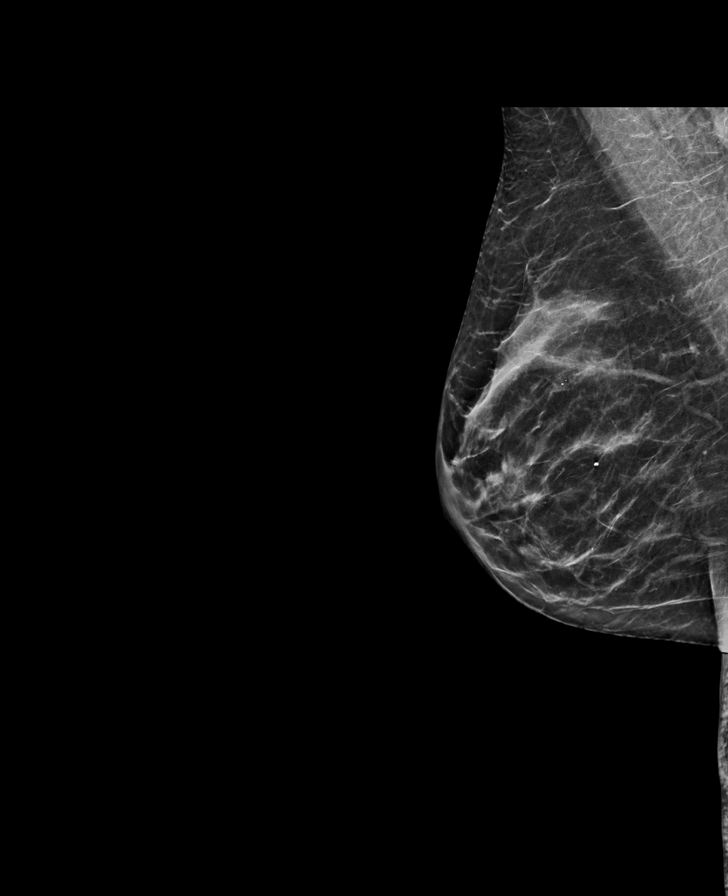

[L MLO synth-2D]
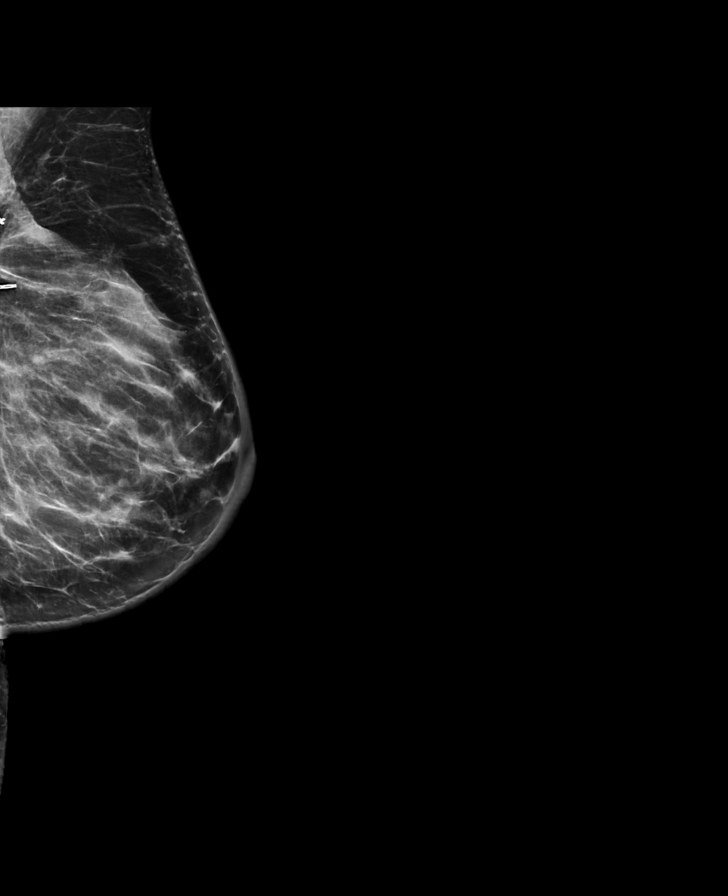

[R CC synth-2D]
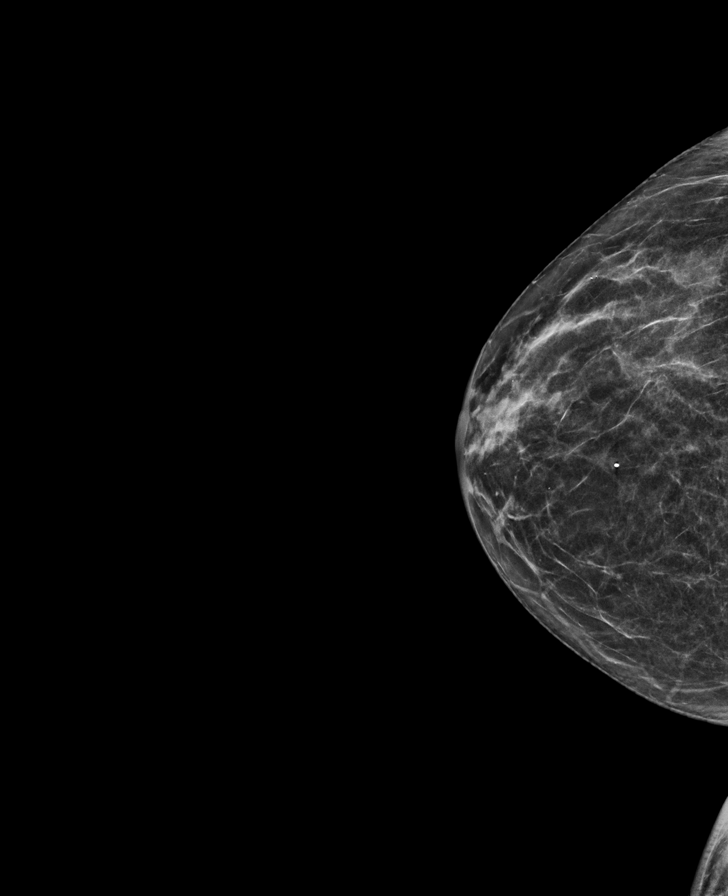

[L ML synth-2D]
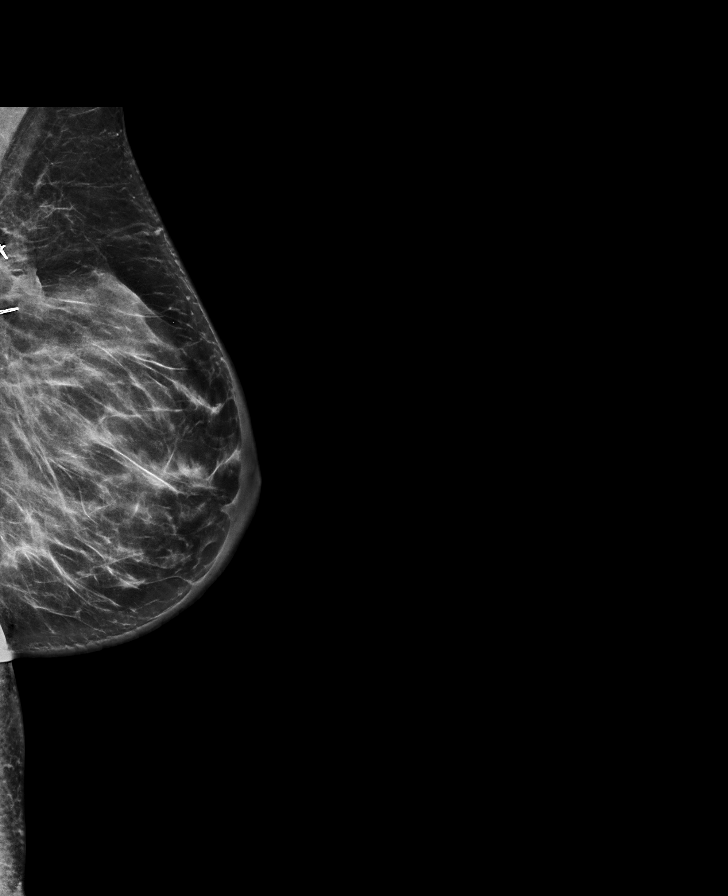

[L CC synth-2D]
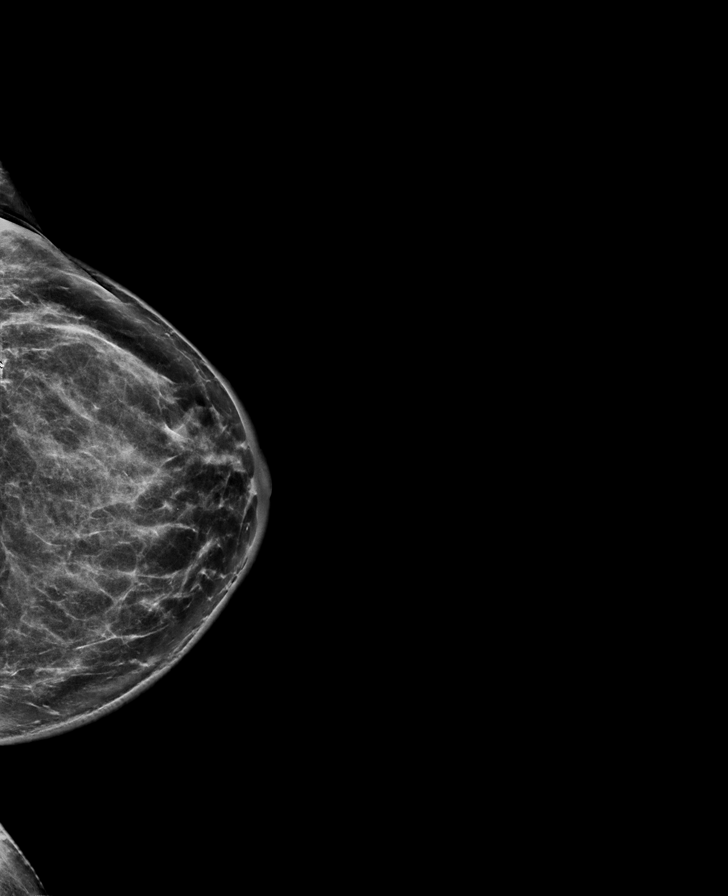

[L ML tomo · tomo slice 40/79.0]
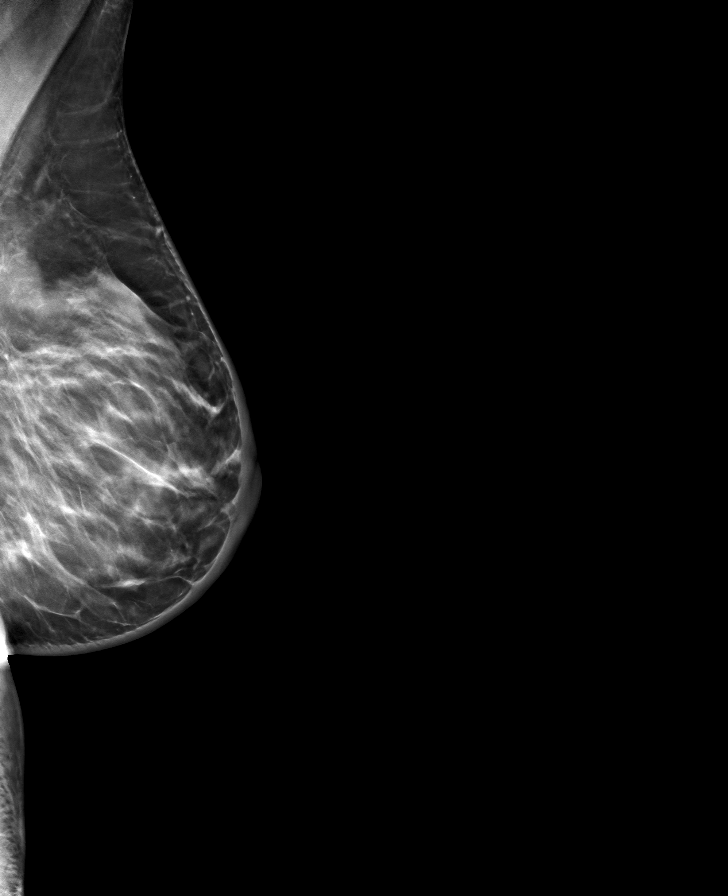

[8 of 37 positions shown; findings below may reference images not displayed]

ACR Breast Density Category c: The breast tissue is heterogeneously
dense, which may obscure small masses.
FINDINGS: 2D and 3D full field views of both breasts and a magnification view
of the lumpectomy site demonstrate no suspicious mass, nonsurgical
distortion or worrisome calcifications.

LEFT lumpectomy changes are noted.
IMPRESSION: No evidence of breast malignancy.

RECOMMENDATION:
Bilateral diagnostic mammogram in 1 year.

I have discussed the findings and recommendations with the patient.
If applicable, a reminder letter will be sent to the patient
regarding the next appointment.

BI-RADS CATEGORY  2: Benign.

## 2023-09-01 ENCOUNTER — Encounter: Payer: Self-pay | Admitting: Nurse Practitioner

## 2023-09-01 NOTE — Assessment & Plan Note (Addendum)
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  -Oncotype DX recurrence score of 6 (low risk) -she received radiation 7/25-8/22/22 under Dr. Mitzi Hansen. -she began tamoxifen in 05/2021. She is tolerating well overall with some hot flashes. -- 08/31/2023 -symptoms getting worse, including hot flashes and joint pain.  She is also having muscle spasms, especially in her neck.  Symptoms are almost intolerable.

## 2023-10-09 ENCOUNTER — Other Ambulatory Visit: Payer: Self-pay

## 2023-10-09 DIAGNOSIS — C50412 Malignant neoplasm of upper-outer quadrant of left female breast: Secondary | ICD-10-CM

## 2023-10-09 NOTE — Assessment & Plan Note (Addendum)
 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  -Oncotype DX recurrence score of 6 (low risk) -she received radiation 7/25-8/22/22 under Dr. Dewey. -she began tamoxifen  in 05/2021.  Due to worsening fatigue and hot flashes, she was switched to letrozole  in December 2024.  However her FSH level was still in perimenopausal, so I switched her back to tamoxifen  with dose reduction to 10 mg daily in February 2025

## 2023-10-10 ENCOUNTER — Inpatient Hospital Stay: Payer: 59 | Attending: Hematology | Admitting: Hematology

## 2023-10-10 ENCOUNTER — Inpatient Hospital Stay: Payer: 59

## 2023-10-10 ENCOUNTER — Encounter: Payer: Self-pay | Admitting: Hematology

## 2023-10-10 VITALS — BP 98/73 | HR 88 | Temp 97.7°F | Resp 16 | Wt 201.0 lb

## 2023-10-10 DIAGNOSIS — E2839 Other primary ovarian failure: Secondary | ICD-10-CM

## 2023-10-10 DIAGNOSIS — C50412 Malignant neoplasm of upper-outer quadrant of left female breast: Secondary | ICD-10-CM

## 2023-10-10 DIAGNOSIS — M25559 Pain in unspecified hip: Secondary | ICD-10-CM | POA: Diagnosis not present

## 2023-10-10 DIAGNOSIS — Z1231 Encounter for screening mammogram for malignant neoplasm of breast: Secondary | ICD-10-CM

## 2023-10-10 DIAGNOSIS — Z79899 Other long term (current) drug therapy: Secondary | ICD-10-CM | POA: Diagnosis not present

## 2023-10-10 DIAGNOSIS — Z800 Family history of malignant neoplasm of digestive organs: Secondary | ICD-10-CM | POA: Insufficient documentation

## 2023-10-10 DIAGNOSIS — R5383 Other fatigue: Secondary | ICD-10-CM | POA: Diagnosis not present

## 2023-10-10 DIAGNOSIS — Z923 Personal history of irradiation: Secondary | ICD-10-CM | POA: Insufficient documentation

## 2023-10-10 DIAGNOSIS — Z87891 Personal history of nicotine dependence: Secondary | ICD-10-CM | POA: Insufficient documentation

## 2023-10-10 DIAGNOSIS — Z79810 Long term (current) use of selective estrogen receptor modulators (SERMs): Secondary | ICD-10-CM | POA: Insufficient documentation

## 2023-10-10 DIAGNOSIS — G8929 Other chronic pain: Secondary | ICD-10-CM | POA: Diagnosis not present

## 2023-10-10 DIAGNOSIS — Z8042 Family history of malignant neoplasm of prostate: Secondary | ICD-10-CM | POA: Insufficient documentation

## 2023-10-10 DIAGNOSIS — Z170 Estrogen receptor positive status [ER+]: Secondary | ICD-10-CM | POA: Insufficient documentation

## 2023-10-10 LAB — CMP (CANCER CENTER ONLY)
ALT: 11 U/L (ref 0–44)
AST: 13 U/L — ABNORMAL LOW (ref 15–41)
Albumin: 4.3 g/dL (ref 3.5–5.0)
Alkaline Phosphatase: 79 U/L (ref 38–126)
Anion gap: 3 — ABNORMAL LOW (ref 5–15)
BUN: 13 mg/dL (ref 6–20)
CO2: 33 mmol/L — ABNORMAL HIGH (ref 22–32)
Calcium: 9.4 mg/dL (ref 8.9–10.3)
Chloride: 104 mmol/L (ref 98–111)
Creatinine: 0.61 mg/dL (ref 0.44–1.00)
GFR, Estimated: >60 mL/min (ref >60)
Glucose, Bld: 74 mg/dL (ref 70–99)
Potassium: 4.6 mmol/L (ref 3.5–5.1)
Sodium: 140 mmol/L (ref 135–145)
Total Bilirubin: 0.3 mg/dL (ref 0.0–1.2)
Total Protein: 7.1 g/dL (ref 6.5–8.1)

## 2023-10-10 LAB — CBC WITH DIFFERENTIAL (CANCER CENTER ONLY)
Abs Immature Granulocytes: 0.01 10*3/uL (ref 0.00–0.07)
Basophils Absolute: 0.1 10*3/uL (ref 0.0–0.1)
Basophils Relative: 1 %
Eosinophils Absolute: 0.4 10*3/uL (ref 0.0–0.5)
Eosinophils Relative: 7 %
HCT: 40.6 % (ref 36.0–46.0)
Hemoglobin: 13 g/dL (ref 12.0–15.0)
Immature Granulocytes: 0 %
Lymphocytes Relative: 29 %
Lymphs Abs: 1.6 10*3/uL (ref 0.7–4.0)
MCH: 29.5 pg (ref 26.0–34.0)
MCHC: 32 g/dL (ref 30.0–36.0)
MCV: 92.1 fL (ref 80.0–100.0)
Monocytes Absolute: 0.5 10*3/uL (ref 0.1–1.0)
Monocytes Relative: 9 %
Neutro Abs: 3 10*3/uL (ref 1.7–7.7)
Neutrophils Relative %: 54 %
Platelet Count: 265 10*3/uL (ref 150–400)
RBC: 4.41 MIL/uL (ref 3.87–5.11)
RDW: 12.3 % (ref 11.5–15.5)
WBC Count: 5.6 10*3/uL (ref 4.0–10.5)
nRBC: 0 % (ref 0.0–0.2)

## 2023-10-10 MED ORDER — TAMOXIFEN CITRATE 10 MG PO TABS: 10.0000 mg | ORAL_TABLET | Freq: Every day | ORAL | 5 refills | Status: DC

## 2023-10-10 NOTE — Progress Notes (Signed)
 [facility]   [phone] Fax:([phone]   Clinic Follow up Note   Patient Care Team: [name], FNP as PCP - General (Family Medicine) Vernetta Berg, MD as Consulting Physician (General Surgery) Lanny Callander, MD as Consulting Physician (Hematology) Dewey Rush, MD as Consulting Physician (Radiation Oncology) Burton, Lacie K, NP as Nurse Practitioner (Nurse Practitioner)  Date of Service:  10/10/2023  CHIEF COMPLAINT: f/u of left breast cancer  CURRENT THERAPY:  Letrozole   Oncology History   Malignant neoplasm of upper-outer quadrant of left breast in female, estrogen receptor positive (HCC) Stage IA, pT1bN0M0, stage IA, ER+/PR+/HER2-, Grade I/II  -She was found to have a 0.8cm left breast mass with biopsy confirmed invasive ductal carcinoma and components of DCIS.  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  -Oncotype DX recurrence score of 6 (low risk) -she received radiation 7/25-8/22/22 under Dr. Dewey. -she began tamoxifen  in 05/2021.  Due to worsening fatigue and hot flashes, she was switched to letrozole  in December 2024.  However her FSH level was still in perimenopausal, so I switched her back to tamoxifen  with dose reduction to 10 mg daily in February 2025   Assessment and Plan    Breast Cancer Follow-Up 53 year old female with breast cancer, currently on letrozole  after significant side effects from tamoxifen , including severe fatigue and hip pain. Reports improvement in fatigue and hip pain after switching to letrozole . Letrozole  may be less effective as she is not fully postmenopausal. Discussed risks of continuing letrozole  in a premenopausal state and potential benefits of switching back to tamoxifen  at a lower dose. Explained tamoxifen 's side effects (fatigue, joint pain) but greater efficacy in premenopausal women.  Consider discontinuing tamoxifen  if side effects are intolerable due to early-stage cancer. - Switch to half dose tamoxifen  (10 mg) - Order CBC and bone density scan - Schedule mammogram with contrast in May - Check hormonal levels in August  Hip Pain Chronic hip pain, likely exacerbated by previous tamoxifen  use and current lack of estrogen. Pain impacts sleep and daily activities. Discussed benefits of low-impact exercises (biking, swimming) to alleviate joint pain. Advised against estrogen-containing supplements due to breast cancer history. - Recommend low-impact exercises (biking, swimming) - Consider weight training with caution  General Health Maintenance Discussed importance of regular exercise and maintaining joint health. Advised against estrogen-containing supplements due to breast cancer history. Discussed Thrive patch and need to consult a pharmacist to ensure it does not contain estrogen or other hormones. - Check with pharmacist regarding Thrive patch ingredients - Encourage regular physical activity and weight management  Plan -Lab reviewed, her FSH is still in perimenopausal range -Will stop letrozole , I called in tamoxifen  10 mg daily, she will start - Follow-up appointment in August, we will repeat her FSH level on next visit - Coordinate bone density scan and mammogram scheduling.         SUMMARY OF ONCOLOGIC HISTORY: Oncology History Overview Note  Cancer Staging Malignant neoplasm of upper-outer quadrant of left breast in female, estrogen receptor positive (HCC) Staging form: Breast, AJCC 8th Edition - Clinical stage from 01/11/2021: Stage IA (cT1b, cN0, cM0, G2, ER+, PR+, HER2-) - Signed by Lanny Callander, MD on 01/11/2021 Stage prefix: Initial diagnosis Histologic grading system: 3 grade system Laterality: Left Staged by: Pathologist and managing physician Stage used in treatment planning: Yes National guidelines used in treatment planning: Yes Type of national  guideline used in  treatment planning: NCCN - Pathologic stage from 02/10/2021: Stage IA (pT1b, pN0, cM0, G2, ER+, PR+, HER2-) - Signed by Lanny Callander, MD on 04/12/2021 Stage prefix: Initial diagnosis Histologic grading system: 3 grade system    Malignant neoplasm of upper-outer quadrant of left breast in female, estrogen receptor positive (HCC)  01/05/2021 Initial Biopsy   Diagnosis Breast, left, needle core biopsy, upper outer - INVASIVE DUCTAL CARCINOMA, GRADE 1/2. - DUCTAL CARCINOMA IN SITU. Microscopic Comment The greatest tumor dimension is 0.8 cm. A breast prognostic profile will be performed. Dr. Alvaro agrees.   01/05/2021 Receptors her2   PROGNOSTIC INDICATORS Results: IMMUNOHISTOCHEMICAL AND MORPHOMETRIC ANALYSIS PERFORMED MANUALLY The tumor cells are NEGATIVE for Her2 (1+). Estrogen Receptor: 85%, POSITIVE, MODERATE STAINING INTENSITY Progesterone Receptor: 95%, POSITIVE, STRONG STAINING INTENSITY Proliferation Marker Ki67: 10%   01/05/2021 Mammogram   Mammogram  Left breast mass 1:30 position   01/10/2021 Initial Diagnosis   Malignant neoplasm of upper-outer quadrant of left breast in female, estrogen receptor positive (HCC)   01/11/2021 Cancer Staging   Staging form: Breast, AJCC 8th Edition - Clinical stage from 01/11/2021: Stage IA (cT1b, cN0, cM0, G2, ER+, PR+, HER2-) - Signed by Lanny Callander, MD on 01/11/2021 Stage prefix: Initial diagnosis Histologic grading system: 3 grade system Laterality: Left Staged by: Pathologist and managing physician Stage used in treatment planning: Yes National guidelines used in treatment planning: Yes Type of national guideline used in treatment planning: NCCN   02/10/2021 Cancer Staging   Staging form: Breast, AJCC 8th Edition - Pathologic stage from 02/10/2021: Stage IA (pT1b, pN0, cM0, G2, ER+, PR+, HER2-) - Signed by Lanny Callander, MD on 04/12/2021 Stage prefix: Initial diagnosis Histologic grading system: 3 grade system   02/10/2021  Pathology Results   FINAL MICROSCOPIC DIAGNOSIS:   A. 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 BREAST, LEFT NEW MEDIAL/INFERIOR MARGIN, EXCISION:  -  No residual carcinoma identified   C. LYMPH NODE, LEFT AXILLARY, SENTINEL, EXCISION:  -  No carcinoma identified in one lymph node (0/1)   D. LYMPH NODE, LEFT AXILLARY, SENTINEL, EXCISION:  -  No carcinoma identified in one lymph node (0/1)   E. LYMPH NODE, LEFT AXILLARY, SENTINEL, EXCISION:  -  No carcinoma identified in one lymph node (0/1)    02/10/2021 Oncotype testing   Recurrence score of 6, risk of distant metastasis 3%, no benefit from chemo   03/27/2021 - 04/24/2021 Radiation Therapy   Under Dr. Dewey      Discussed the use of AI scribe software for clinical note transcription with the patient, who gave verbal consent to proceed.  History of Present Illness   The patient, a 53 year old female with a history of breast cancer, presents for a follow-up visit. She reports experiencing significant neck pain, described as spasms, and hip pain. The pain in her hips is so severe that it disrupts her sleep and limits her mobility, to the point where she struggles to get up from the floor. She has sought physical therapy for these issues.  The patient was previously on tamoxifen  for her breast cancer, but due to the side effects, including extreme fatigue and exacerbated hip pain, her medication was switched to letrozole . She reports that the switch has helped, and  she is no longer as fatigued as she was on tamoxifen . However, she still experiences some hip pain.  The patient also mentions that she has been struggling with weight gain and is considering a weight loss program called [facility]. She is unsure  if this program is suitable for her given her history of breast cancer and is seeking advice.  She also reports a history of menopause and is currently on hormone therapy. She has noticed a decrease in her sex drive since starting the therapy.         All other systems were reviewed with the patient and are negative.  MEDICAL HISTORY:  Past Medical History:  Diagnosis Date   Cancer (HCC) 01/2021   left breast IDC in situ   Complication of anesthesia    Family history of adverse reaction to anesthesia    Family history of colon cancer    Family history of pancreatic cancer    Family history of prostate cancer    Grover's disease     SURGICAL HISTORY: Past Surgical History:  Procedure Laterality Date   BREAST LUMPECTOMY WITH RADIOACTIVE SEED AND SENTINEL LYMPH NODE BIOPSY Left 02/10/2021   Procedure: LEFT BREAST LUMPECTOMY WITH RADIOACTIVE SEED AND SENTINEL LYMPH NODE BIOPSY;  Surgeon: Vernetta Berg, MD;  Location:  [facility];  Service: General;  Laterality: Left;   CESAREAN SECTION     x3    I have reviewed the social history and family history with the patient and they are unchanged from previous note.  ALLERGIES:  is allergic to methotrexate derivatives and diflucan  [fluconazole ].  MEDICATIONS:  Current Outpatient Medications  Medication Sig Dispense Refill   tamoxifen  (NOLVADEX ) 10 MG tablet Take 1 tablet (10 mg total) by mouth daily. 30 tablet 5   clobetasol  ointment (TEMOVATE ) 0.05 % Apply 1 Application topically 2 (two) times daily. Apply as directed twice daily 60 g 0   ibuprofen (ADVIL) 200 MG tablet Take 200 mg by mouth every 6 (six) hours as needed.     letrozole  (FEMARA ) 2.5 MG tablet Take 1 tablet (2.5 mg total) by mouth daily. 30 tablet 2   Multiple Vitamins-Minerals (AIRBORNE GUMMIES PO) Take by mouth.     terconazole  (TERAZOL 3 ) 0.8 % vaginal cream Place 1 applicator vaginally at bedtime. Use nightly for 3 nights. 20 g 1   No current  facility-administered medications for this visit.    PHYSICAL EXAMINATION: ECOG PERFORMANCE STATUS: 0 - Asymptomatic  Vitals:   10/10/23 1116  BP: 98/73  Pulse: 88  Resp: 16  Temp: 97.7 F (36.5 C)  SpO2: 98%   Wt Readings from Last 3 Encounters:  10/10/23 91.2 kg  08/12/23 87.3 kg  04/12/23 85.1 kg     GENERAL:alert, no distress and comfortable SKIN: skin color, texture, turgor are normal, no rashes or significant lesions EYES: normal, Conjunctiva are pink and non-injected, sclera clear NECK: supple, thyroid  normal size, non-tender, without nodularity LYMPH:  no palpable lymphadenopathy in the cervical, axillary  LUNGS: clear to auscultation and percussion with normal breathing effort HEART: regular rate & rhythm and no murmurs and no lower extremity edema ABDOMEN:abdomen soft, non-tender and normal bowel sounds Musculoskeletal:no cyanosis of digits and no clubbing  NEURO: alert & oriented x 3 with fluent speech, no focal motor/sensory deficits BREAST: Surgical incision in left breast has healed well, with minimal scar tissue.  Bilateral breast exam showed no palpable mass or adenopathy.  LABORATORY DATA:  I have reviewed the data as listed  Latest Ref Rng & Units 10/10/2023   10:51 AM 08/20/2023    9:33 AM 04/12/2023   11:01 AM  CBC  WBC 4.0 - 10.5 K/uL 5.6  4.5  4.8   Hemoglobin 12.0 - 15.0 g/dL 86.9  86.7  86.8   Hematocrit 36.0 - 46.0 % 40.6  40.5  39.5   Platelets 150 - 400 K/uL 265  256  228         Latest Ref Rng & Units 10/10/2023   10:51 AM 08/20/2023    9:33 AM 04/12/2023   11:01 AM  CMP  Glucose 70 - 99 mg/dL 74  79  73   BUN 6 - 20 mg/dL 13  11  14    Creatinine 0.44 - 1.00 mg/dL 9.38  9.33  9.34   Sodium 135 - 145 mmol/L 140  140  143   Potassium 3.5 - 5.1 mmol/L 4.6  4.1  4.2   Chloride 98 - 111 mmol/L 104  103  107   CO2 22 - 32 mmol/L 33  33  31   Calcium 8.9 - 10.3 mg/dL 9.4  9.3  9.2   Total Protein 6.5 - 8.1 g/dL 7.1  6.7  6.8   Total  Bilirubin 0.0 - 1.2 mg/dL 0.3  0.3  0.3   Alkaline Phos 38 - 126 U/L 79  63  57   AST 15 - 41 U/L 13  13  13    ALT 0 - 44 U/L 11  8  9        RADIOGRAPHIC STUDIES: I have personally reviewed the radiological images as listed and agreed with the findings in the report. No results found.    Orders Placed This Encounter  Procedures   MM DIGITAL SCREENING BILATERAL    WITH IV CONTRAST    Standing Status:   Future    Expected Date:   01/04/2024    Expiration Date:   10/09/2024    Reason for Exam (SYMPTOM  OR DIAGNOSIS REQUIRED):   SCREENING    Is the patient pregnant?:   No    Preferred [facility]   DG Bone Density    Standing Status:   Future    Expected Date:   01/07/2024    Expiration Date:   10/09/2024    Reason for Exam (SYMPTOM  OR DIAGNOSIS REQUIRED):   SCREENING    Is the patient pregnant?:   No    Preferred imaging location?:   [facility]    Release to patient:   Immediate   CBC with Differential/Platelet    Standing Status:   Standing    Number of Occurrences:   50    Expiration Date:   10/09/2024   Comprehensive metabolic panel    Standing Status:   Standing    Number of Occurrences:   50    Expiration Date:   10/09/2024   [facility]    Standing Status:   Future    Expected Date:   04/08/2024    Expiration Date:   10/09/2024   Estriol    Standing Status:   Future    Expected Date:   04/08/2024    Expiration Date:   10/09/2024   All questions were answered. The patient knows to call the clinic with any problems, questions or concerns. No barriers to learning was detected. The total time spent in the appointment was 30 minutes.     Onita Mattock, MD 10/10/2023

## 2023-10-14 ENCOUNTER — Other Ambulatory Visit: Payer: Self-pay

## 2023-10-14 ENCOUNTER — Telehealth: Payer: Self-pay

## 2023-10-14 NOTE — Telephone Encounter (Signed)
 Spoke with pt via telephone to inform pt that Dr. Maryalice Smaller does not recommend the pt taking the weight loss medication they discussed in clinic.  Stated Dr. Maryalice Smaller checked with our Pharmacist to see if the medication had interactions w/the pt's AI medication Tamoxifen  and the weight loss medication does have interactions.  Stated Dr. Maryalice Smaller is open to referring pt to a weight loss clinic if the pt is interested.  Pt stated she is interested in being referred to the weight loss clinic.  Stated this nurse will also send the full details of what the pharmacist stated regarding the drug interaction in a MyChart message to the pt.  Pt verbalized understanding and had no further questions or concerns.

## 2023-10-31 ENCOUNTER — Other Ambulatory Visit: Payer: Self-pay

## 2023-10-31 DIAGNOSIS — E6609 Other obesity due to excess calories: Secondary | ICD-10-CM

## 2023-10-31 DIAGNOSIS — C50412 Malignant neoplasm of upper-outer quadrant of left female breast: Secondary | ICD-10-CM

## 2023-11-06 ENCOUNTER — Encounter: Payer: 59 | Admitting: Bariatrics

## 2023-11-12 DIAGNOSIS — Z0289 Encounter for other administrative examinations: Secondary | ICD-10-CM

## 2023-11-13 ENCOUNTER — Encounter: Payer: Self-pay | Admitting: Family Medicine

## 2023-11-13 ENCOUNTER — Ambulatory Visit: Admitting: Family Medicine

## 2023-11-13 VITALS — BP 106/73 | HR 81 | Temp 98.5°F | Ht 66.0 in | Wt 200.0 lb

## 2023-11-13 DIAGNOSIS — Z170 Estrogen receptor positive status [ER+]: Secondary | ICD-10-CM

## 2023-11-13 DIAGNOSIS — E6609 Other obesity due to excess calories: Secondary | ICD-10-CM

## 2023-11-13 DIAGNOSIS — M25552 Pain in left hip: Secondary | ICD-10-CM | POA: Diagnosis not present

## 2023-11-13 DIAGNOSIS — M25551 Pain in right hip: Secondary | ICD-10-CM | POA: Diagnosis not present

## 2023-11-13 DIAGNOSIS — C50412 Malignant neoplasm of upper-outer quadrant of left female breast: Secondary | ICD-10-CM

## 2023-11-13 DIAGNOSIS — E66811 Obesity, class 1: Secondary | ICD-10-CM

## 2023-11-13 DIAGNOSIS — Z6832 Body mass index (BMI) 32.0-32.9, adult: Secondary | ICD-10-CM

## 2023-11-13 NOTE — Progress Notes (Signed)
 Office: [phone]  /  Fax: [phone]   Initial Visit  Brandi Phelps was seen in clinic today to evaluate for obesity. She is interested in losing weight to improve overall health and reduce the risk of weight related complications. She presents today to review program treatment options, initial physical assessment, and evaluation.     She was referred by: Specialist  When asked what else they would like to accomplish? She states: Improve energy levels and physical activity and Improve quality of life  she would like to get back to 160 lb.    Weight history:  has gained weight on Tamoxifen with joint pain and fatigue. Has been on 2.5 years.  She will be on it for 2.5 more years.   Retired Runner, broadcasting/film[employer]video 2023; has 2 sons in college, daughter in high school  When asked how has your weight affected you? She states: Contributed to medical problems and Having fatigue  Some associated conditions: Other: hx of breast cancer; hip pain  Contributing factors: Reduced physical activity and Menopause  Weight promoting medications identified: Other: Tamoxifen  Current nutrition plan: None  Current level of physical activity: Walking not doing consistently due to bilat hip pain   Current or previous pharmacotherapy: None  Response to medication: Never tried medications   Past medical history includes:   Past Medical History:  Diagnosis Date   Cancer (HCC) 01/2021   left breast IDC in situ   Complication of anesthesia    Family history of adverse reaction to anesthesia    Family history of colon cancer    Family history of pancreatic cancer    Family history of prostate cancer    Grover's disease      Objective:   BP 106/73   Pulse 81   Temp 98.5 F (36.9 C)   Ht 5\' 6"  (1.676 m)   Wt 200 lb (90.7 kg)   SpO2 100%   BMI 32.28 kg/m  She was weighed on the bioimpedance scale: Body mass index is 32.28 kg/m.  Peak Weight:210 , Body Fat%:41.6, Visceral Fat Rating:10, Weight  trend over the last 12 months: Increasing  General:  Alert, oriented and cooperative. Patient is in no acute distress.  Respiratory: Normal respiratory effort, no problems with respiration noted   Gait: able to ambulate independently  Mental Status: Normal mood and affect. Normal behavior. Normal judgment and thought content.   DIAGNOSTIC DATA REVIEWED:  BMET    Component Value Date/Time   NA 140 10/10/2023 1051   K 4.6 10/10/2023 1051   CL 104 10/10/2023 1051   CO2 33 (H) 10/10/2023 1051   GLUCOSE 74 10/10/2023 1051   BUN 13 10/10/2023 1051   CREATININE 0.61 10/10/2023 1051   CALCIUM 9.4 10/10/2023 1051   GFRNONAA >60 10/10/2023 1051   Lab Results  Component Value Date   HGBA1C 5.0 02/04/2023   No results found for: "INSULIN" CBC    Component Value Date/Time   WBC 5.6 10/10/2023 1051   RBC 4.41 10/10/2023 1051   HGB 13.0 10/10/2023 1051   HCT 40.6 10/10/2023 1051   PLT 265 10/10/2023 1051   MCV 92.1 10/10/2023 1051   MCH 29.5 10/10/2023 1051   MCHC 32.0 10/10/2023 1051   RDW 12.3 10/10/2023 1051   Iron/TIBC/Ferritin/ %Sat No results found for: "IRON", "TIBC", "FERRITIN", "IRONPCTSAT" Lipid Panel  No results found for: "CHOL", "TRIG", "HDL", "CHOLHDL", "VLDL", "LDLCALC", "LDLDIRECT" Hepatic Function Panel     Component Value Date/Time   PROT 7.1 10/10/2023  1051   ALBUMIN 4.3 10/10/2023 1051   AST 13 (L) 10/10/2023 1051   ALT 11 10/10/2023 1051   ALKPHOS 79 10/10/2023 1051   BILITOT 0.3 10/10/2023 1051      Component Value Date/Time   TSH 2.254 08/20/2023 0933     Assessment and Plan:   Bilateral hip pain Bilateral hip pain began with use of tamoxifen and has not improved much reducing her dose.  She will look for improvements in hip pain with weight reduction.  Consider exercise such as water aerobics.  Class 1 obesity due to excess calories with body mass index (BMI) of 32.0 to 32.9 in adult, unspecified whether serious comorbidity present .  Due to  weight history, program information and patient's readiness to change.  Set up initial visit with fasting IC and EKG  Malignant neoplasm of upper-outer quadrant of left breast in female, estrogen receptor positive (HCC) She is managed by Dr. Mosetta Putt On tamoxifen for approximately 2-1/2 more years Diagnosed May 2022 status post radiation therapy with Dr. Mitzi Hansen Continue active plan of care per oncology.  We discussed the importance of body fat reduction for reducing chance for future weight related cancers       Obesity Treatment / Action Plan:  Patient will work on garnering support from family and friends to begin weight loss journey. Will work on eliminating or reducing the presence of highly palatable, calorie dense foods in the home. Will complete provided nutritional and psychosocial assessment questionnaire before the next appointment. Will be scheduled for indirect calorimetry to determine resting energy expenditure in a fasting state.  This will allow [place] to create a reduced calorie, high-protein meal plan to promote loss of fat mass while preserving muscle mass. Will think about ideas on how to incorporate physical activity into their daily routine. Counseled on the health benefits of losing 5%-15% of total body weight. Was counseled on nutritional approaches to weight loss and benefits of reducing processed foods and consuming plant-based foods and high quality protein as part of nutritional weight management. Was counseled on pharmacotherapy and role as an adjunct in weight management.   Obesity Education Performed Today:  She was weighed on the bioimpedance scale and results were discussed and documented in the synopsis.  We discussed obesity as a disease and the importance of a more detailed evaluation of all the factors contributing to the disease.  We discussed the importance of long term lifestyle changes which include nutrition, exercise and behavioral modifications as well as  the importance of customizing this to her specific health and social needs.  We discussed the benefits of reaching a healthier weight to alleviate the symptoms of existing conditions and reduce the risks of the biomechanical, metabolic and psychological effects of obesity.  Brandi Phelps appears to be in the action stage of change and states they are ready to start intensive lifestyle modifications and behavioral modifications.  30 minutes was spent today on this visit including the above counseling, pre-visit chart review, and post-visit documentation.  Reviewed by clinician on day of visit: allergies, medications, problem list, medical history, surgical history, family history, social history, and previous encounter notes pertinent to obesity diagnosis.    Seymour Bars, D.O. DABFM, [facility] [address] [place], [place] [address] [phone]

## 2023-11-19 ENCOUNTER — Ambulatory Visit: Admitting: Family Medicine

## 2023-11-19 ENCOUNTER — Encounter: Payer: Self-pay | Admitting: Family Medicine

## 2023-11-19 VITALS — BP 117/78 | HR 71 | Temp 98.0°F | Ht 66.0 in | Wt 198.0 lb

## 2023-11-19 DIAGNOSIS — R5383 Other fatigue: Secondary | ICD-10-CM | POA: Diagnosis not present

## 2023-11-19 DIAGNOSIS — N951 Menopausal and female climacteric states: Secondary | ICD-10-CM | POA: Diagnosis not present

## 2023-11-19 DIAGNOSIS — Z1331 Encounter for screening for depression: Secondary | ICD-10-CM

## 2023-11-19 DIAGNOSIS — E66811 Obesity, class 1: Secondary | ICD-10-CM | POA: Diagnosis not present

## 2023-11-19 DIAGNOSIS — M25552 Pain in left hip: Secondary | ICD-10-CM

## 2023-11-19 DIAGNOSIS — M25551 Pain in right hip: Secondary | ICD-10-CM | POA: Diagnosis not present

## 2023-11-19 DIAGNOSIS — E6609 Other obesity due to excess calories: Secondary | ICD-10-CM

## 2023-11-19 DIAGNOSIS — R0602 Shortness of breath: Secondary | ICD-10-CM | POA: Diagnosis not present

## 2023-11-19 DIAGNOSIS — Z6831 Body mass index (BMI) 31.0-31.9, adult: Secondary | ICD-10-CM

## 2023-11-19 DIAGNOSIS — Z853 Personal history of malignant neoplasm of breast: Secondary | ICD-10-CM | POA: Insufficient documentation

## 2023-11-19 NOTE — Progress Notes (Signed)
 At a Glance:  Vitals Temp: 98 F (36.7 C) BP: 117/78 Pulse Rate: 71 SpO2: 99 %   Anthropometric Measurements Height: 5\' 6"  (1.676 m) Weight: 198 lb (89.8 kg) BMI (Calculated): 31.97 Starting Weight: 198lb Peak Weight: 210lb   Body Composition  Body Fat %: 41.2 % Fat Mass (lbs): 81.6 lbs Muscle Mass (lbs): 110.8 lbs Total Body Water (lbs): 81 lbs Visceral Fat Rating : 10   Other Clinical Data RMR: 1685 Fasting: Yes Labs: Yes Today's Visit #: 1 Starting Date: 11/19/23    EKG: Normal sinus rhythm, rate 71.  Indirect Calorimeter completed today shows a VO2 of 245 and a REE of 1685.  Her calculated basal metabolic rate is 1610 thus her basal metabolic rate is better than expected.  Chief Complaint:  Obesity   Subjective:  Brandi Phelps (MR# [id number]) is a 53 y.o. female who presents for evaluation and treatment of obesity and related comorbidities.   Brandi Phelps is currently in the action stage of change and ready to dedicate time achieving and maintaining a healthier weight. Brandi Phelps is interested in becoming our patient and working on intensive lifestyle modifications including (but not limited to) diet and exercise for weight loss.  Brandi Phelps has been struggling with her weight. She has been unsuccessful in either losing weight, maintaining weight loss, or reaching her healthy weight goal.  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  She is married.  Her youngest child is at home in high school.  She has 2 sons in college.  She used to enjoy walking 3+ miles daily but this has reduced since taking tamoxifen post breast cancer treatment due to bilateral hip pain.  Brandi Phelps's habits were reviewed today and are as follows: Her family eats meals together, her desired weight loss is 50 lb, she started gaining weight over the past few years, she has significant food cravings issues, she snacks frequently in the evenings, she is  frequently drinking liquids with calories, she frequently makes poor food choices, she has problems with excessive hunger, and she struggles with emotional eating.  Other Fatigue Brandi Phelps denies daytime somnolence and denies waking up still tired. Patient has a history of symptoms of none. Brandi Phelps generally gets 6 or 7 hours of sleep per night, and states that she has generally restful sleep. Snoring is present. Apneic episodes are not present. Epworth Sleepiness Score is 10.   Shortness of Breath Brandi Phelps notes increasing shortness of breath with exercising and seems to be worsening over time with weight gain. She notes getting out of breath sooner with activity than she used to. This has gotten worse recently. Brandi Phelps denies shortness of breath at rest or orthopnea.   Depression Screen Brandi Phelps's Food and Mood (modified PHQ-9) score was 9.      No data to display           Assessment and Plan:   Other Fatigue Brandi Phelps does feel that her weight is causing her energy to be lower than it should be. Fatigue may be related to obesity, depression or many other causes. Labs will be ordered, and in the meanwhile, Brandi Phelps will focus on self care including making healthy food choices, increasing physical activity and focusing on stress reduction.  Shortness of Breath Brandi Phelps does feel that she gets out of breath more easily that she used to when she exercises. Brandi Phelps's shortness of breath appears to be obesity related and exercise induced. She has agreed to work  on weight loss and gradually increase exercise to treat her exercise induced shortness of breath. Will continue to monitor closely.  Brandi Phelps had a positive depression screening. Depression is commonly associated with obesity and often results in emotional eating behaviors. We will monitor this closely and work on CBT to help improve the non-hunger eating patterns. Referral to Psychology may be required if no improvement is seen as  she continues in our clinic.    Problem List Items Addressed This Visit     History of left breast cancer Managed by Dr Mosetta Putt, she plans to be on Tamoxifen about 2.5 more years Bilateral hip pain has reduced some with reducing dose of tamoxifen and a half.  She denies vasomotor flushing.  Begin work on reducing body fat percent in order to reduce risk of future weight related cancers.   Perimenopausal Her last LMP was July 2024.  She denies much issue with vasomotor flushing but has some sleep disruption.  She  has found it harder to lose weight over the past 2 years.  follow-up with OB/GYN for routine Pap smears.  Watch for any irregular uterine bleeding with use of tamoxifen.   Bilateral hip pain Slightly improved with reducing tamoxifen dose in half per oncology.  She has been able to do some walking but would like to increase her distance.  We have discussed adding in stretching at home and some light weight training.  Begin active job of weight reduction.   Other Visit Diagnoses       SOBOE (shortness of breath on exertion)    -  Primary     Other fatigue       Relevant Orders   EKG 12-Lead (Completed)   VITAMIN D 25 Hydroxy (Vit-D Deficiency, Fractures)   Lipid panel   Insulin, random   Hemoglobin A1c   Folate   Comprehensive metabolic panel   Vitamin B12     Class 1 obesity due to excess calories with body mass index (BMI) of 31.0 to 31.9 in adult, unspecified whether serious comorbidity present           Brandi Phelps is currently in the action stage of change and her goal is to continue with weight loss efforts. I recommend Brandi Phelps begin the structured treatment plan as follows:  She has agreed to Category 3 Plan  Exercise goals: All adults should avoid inactivity. Some activity is better than none, and adults who participate in any amount of physical activity, gain some health benefits.  Behavioral modification strategies:increasing lean protein intake, increasing  vegetables, increase H2O intake, decrease liquid calories, increase high fiber foods, decreasing eating out, no skipping meals, meal planning and cooking strategies, keeping healthy foods in the home, better snacking choices, planning for success, and decrease junk food   She was informed of the importance of frequent follow-up visits to maximize her success with intensive lifestyle modifications for her multiple health conditions. She was informed we would discuss her lab results at her next visit unless there is a critical issue that needs to be addressed sooner. Amauria agreed to keep her next visit at the agreed upon time to discuss these results.  Objective:  General: Cooperative, alert, well developed, in no acute distress. HEENT: Conjunctivae and lids unremarkable. Cardiovascular: Regular rhythm.  Lungs: Normal work of breathing. Neurologic: No focal deficits.   Lab Results  Component Value Date   CREATININE 0.61 10/10/2023   BUN 13 10/10/2023   NA 140 10/10/2023   K 4.6 10/10/2023  CL 104 10/10/2023   CO2 33 (H) 10/10/2023   Lab Results  Component Value Date   ALT 11 10/10/2023   AST 13 (L) 10/10/2023   ALKPHOS 79 10/10/2023   BILITOT 0.3 10/10/2023   Lab Results  Component Value Date   HGBA1C 5.0 02/04/2023   No results found for: "INSULIN" Lab Results  Component Value Date   TSH 2.254 08/20/2023   No results found for: "CHOL", "HDL", "LDLCALC", "LDLDIRECT", "TRIG", "CHOLHDL" Lab Results  Component Value Date   WBC 5.6 10/10/2023   HGB 13.0 10/10/2023   HCT 40.6 10/10/2023   MCV 92.1 10/10/2023   PLT 265 10/10/2023   No results found for: "IRON", "TIBC", "FERRITIN"  Attestation Statements:  Reviewed by clinician on day of visit: allergies, medications, problem list, medical history, surgical history, family history, social history, and previous encounter notes.  Time spent on visit including pre-visit chart review and post-visit charting and care was 40  minutes.   Glennis Brink, DO

## 2023-11-26 LAB — LIPID PANEL
Chol/HDL Ratio: 2.9 ratio (ref 0.0–4.4)
Cholesterol, Total: 223 mg/dL — ABNORMAL HIGH (ref 100–199)
HDL: 77 mg/dL (ref >39)
LDL Chol Calc (NIH): 128 mg/dL — ABNORMAL HIGH (ref 0–99)
Triglycerides: 106 mg/dL (ref 0–149)
VLDL Cholesterol Cal: 18 mg/dL (ref 5–40)

## 2023-11-26 LAB — COMPREHENSIVE METABOLIC PANEL
ALT: 10 IU/L (ref 0–32)
AST: 14 IU/L (ref 0–40)
Albumin: 4.5 g/dL (ref 3.8–4.9)
Alkaline Phosphatase: 96 IU/L (ref 44–121)
BUN/Creatinine Ratio: 15 (ref 9–23)
BUN: 10 mg/dL (ref 6–24)
Bilirubin Total: 0.3 mg/dL (ref 0.0–1.2)
CO2: 22 mmol/L (ref 20–29)
Calcium: 9.4 mg/dL (ref 8.7–10.2)
Chloride: 105 mmol/L (ref 96–106)
Creatinine, Ser: 0.65 mg/dL (ref 0.57–1.00)
Globulin, Total: 2.3 g/dL (ref 1.5–4.5)
Glucose: 94 mg/dL (ref 70–99)
Potassium: 4.9 mmol/L (ref 3.5–5.2)
Sodium: 143 mmol/L (ref 134–144)
Total Protein: 6.8 g/dL (ref 6.0–8.5)
eGFR: 105 mL/min/{1.73_m2} (ref >59)

## 2023-11-26 LAB — VITAMIN B12: Vitamin B-12: 388 pg/mL (ref 232–1245)

## 2023-11-26 LAB — VITAMIN D 25 HYDROXY (VIT D DEFICIENCY, FRACTURES): Vit D, 25-Hydroxy: 22.3 ng/mL — ABNORMAL LOW (ref 30.0–100.0)

## 2023-11-26 LAB — HEMOGLOBIN A1C
Est. average glucose Bld gHb Est-mCnc: 94 mg/dL
Hgb A1c MFr Bld: 4.9 % (ref 4.8–5.6)

## 2023-11-26 LAB — INSULIN, RANDOM: INSULIN: 8.2 u[IU]/mL (ref 2.6–24.9)

## 2023-11-26 LAB — FOLATE: Folate: 16.5 ng/mL (ref >3.0)

## 2023-12-02 ENCOUNTER — Ambulatory Visit: Admitting: Family Medicine

## 2023-12-02 ENCOUNTER — Encounter: Payer: Self-pay | Admitting: Family Medicine

## 2023-12-02 VITALS — BP 109/70 | HR 75 | Temp 98.1°F | Ht 66.0 in | Wt 191.0 lb

## 2023-12-02 DIAGNOSIS — Z6830 Body mass index (BMI) 30.0-30.9, adult: Secondary | ICD-10-CM

## 2023-12-02 DIAGNOSIS — E559 Vitamin D deficiency, unspecified: Secondary | ICD-10-CM | POA: Diagnosis not present

## 2023-12-02 DIAGNOSIS — E785 Hyperlipidemia, unspecified: Secondary | ICD-10-CM | POA: Insufficient documentation

## 2023-12-02 DIAGNOSIS — Z853 Personal history of malignant neoplasm of breast: Secondary | ICD-10-CM

## 2023-12-02 DIAGNOSIS — E66811 Obesity, class 1: Secondary | ICD-10-CM

## 2023-12-02 DIAGNOSIS — E6609 Other obesity due to excess calories: Secondary | ICD-10-CM

## 2023-12-02 MED ORDER — VITAMIN D (ERGOCALCIFEROL) 1.25 MG (50000 UNIT) PO CAPS
50000.0000 [IU] | ORAL_CAPSULE | ORAL | 0 refills | Status: DC
Start: 2023-12-02 — End: 2023-12-24

## 2023-12-02 NOTE — Progress Notes (Signed)
 Office: [phone]  /  Fax: [phone]  WEIGHT SUMMARY AND BIOMETRICS  Starting Date: 11/19/23  Starting Weight: 198lb   Weight Lost Since Last Visit: 7lb   Vitals Temp: 98.1 F (36.7 C) BP: 109/70 Pulse Rate: 75 SpO2: 100 %   Body Composition  Body Fat %: 38.8 % Fat Mass (lbs): 74.2 lbs Muscle Mass (lbs): 110.8 lbs Total Body Water (lbs): 78.2 lbs Visceral Fat Rating : 9    HPI  Chief Complaint: OBESITY  Brandi Phelps is here to discuss her progress with her obesity treatment plan. She is on the the Category 3 Plan and states she is following her eating plan approximately 80-85 % of the time. She states she is exercising 0 minutes 0 times per week.  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 Body mass index is 30.83 kg/m.  General: She is overweight, cooperative, alert, well developed, and in no acute distress. PSYCH: Has normal mood, affect and thought process.   Lungs: Normal breathing effort, no conversational dyspnea.   ASSESSMENT AND PLAN  TREATMENT PLAN FOR OBESITY:  Recommended Dietary Goals  Brandi Phelps is currently in the action stage of change. As such, her goal is to continue weight management plan. She has agreed to keeping a food journal and adhering to recommended goals of 1500 calories and 90-100 g of   protein.  Behavioral Intervention  We discussed the following Behavioral Modification Strategies today: increasing lean protein intake to established goals, increasing fiber rich foods, increasing water intake , work on meal planning and preparation, work on Counselling psychologist calories using tracking application, reading food labels , keeping healthy foods at home, practice mindfulness eating and understand the difference between hunger signals and cravings, work on managing stress, creating time for self-care and relaxation, avoiding temptations and identifying enticing environmental cues, planning for success, and continue to work on maintaining a reduced calorie state, getting the recommended amount of protein, incorporating whole foods, making healthy choices, staying well hydrated and practicing mindfulness when eating.. Dining out guide given Reviewed dietary change goals on after visit summary  Additional resources provided today: NA  Recommended Physical Activity Goals  Brandi Phelps has been advised to work up to 150 minutes of moderate intensity aerobic activity a week and strengthening exercises 2-3 times per week for cardiovascular health, weight loss maintenance and preservation of muscle mass.   She has agreed to Start aerobic activity with a goal of 150 minutes a week at moderate intensity.   Pharmacotherapy changes for the treatment of obesity: None  ASSOCIATED CONDITIONS ADDRESSED TODAY  Hyperlipidemia, unspecified hyperlipidemia type Reviewed lab results with patient from last visit.  Her 10-year ASCVD risk score remains low.  She has not used lipid-lowering medication.  We discussed dietary choices that have negatively affected her cholesterol such as high saturated fat meats, biscuits, pork rinds  and restaurant foods.  Okay to eat eggs 2 to 3 days a week  Assessment & Plan: The 10-year ASCVD risk score (Arnett DK, et al., 2019) is: 1.1%   Values used to calculate the  score:     Age: 53 years     Sex: Female     Is Non-Hispanic African American: No     Diabetic: No     Tobacco smoker: No     Systolic Blood Pressure: 109 mmHg     Is BP treated: No     HDL Cholesterol: 77 mg/dL     Total Cholesterol: 223 mg/dL    Vitamin D deficiency Last vitamin D Lab Results  Component Value Date   VD25OH 22.3 (L) 11/19/2023  Reviewed lab results with patient.  Vitamin D low.  She is currently not taking a vitamin D supplement.  We discussed the importance of vitamin D for energy levels, bone health and immune function.  Begin vitamin D 50,000 IU once weekly.  Recheck lab in 3 to 4 months  -     Vitamin D (Ergocalciferol); Take 1 capsule (50,000 Units total) by mouth every 7 (seven) days.  Dispense: 5 capsule; Refill: 0  Class 1 obesity due to excess calories without serious comorbidity with body mass index (BMI) of 30.0 to 30.9 in adult  History of left breast cancer She is actively working on reducing her percent body fat to reduce her risk of weight related cancers.  She is doing well on tamoxifen 10 mg daily per oncology.  Her hip pain has improved by reducing her dose.   Add in 30 minutes of walking 5 days a week  She was informed of the importance of frequent follow up visits to maximize her success with intensive lifestyle modifications for her multiple health conditions.   ATTESTASTION STATEMENTS:  Reviewed by clinician on day of visit: allergies, medications, problem list, medical history, surgical history, family history, social history, and previous encounter notes pertinent to obesity diagnosis.   I have personally spent 30 minutes total time today in preparation, patient care, nutritional counseling and documentation for this visit, including the following: review of clinical lab tests; review of medical tests/procedures/services.      Glennis Brink, DO DABFM, DABOM [facility] [address] [place], [place]  [address] [phone]

## 2023-12-02 NOTE — Assessment & Plan Note (Signed)
 The 10-year ASCVD risk score (Arnett DK, et al., 2019) is: 1.1%   Values used to calculate the score:     Age: 54 years     Sex: Female     Is Non-Hispanic African American: No     Diabetic: No     Tobacco smoker: No     Systolic Blood Pressure: 109 mmHg     Is BP treated: No     HDL Cholesterol: 77 mg/dL     Total Cholesterol: 223 mg/dL

## 2023-12-02 NOTE — Patient Instructions (Addendum)
 Aim for 1500 cal / day using the LOSE IT APP This should include 90-100 g of protein daily Don't add workouts into the App  Avoid products with over 8 g of added sugar per serving-- read labels  You can have healthy carbs: Fruit (fresh or frozen)-- any Beans Corn Potatoes  Barilla protein pasta Purely [employer] or [employer]  Add in 30-45 min of walking 4 days/ week  Begin a Multivitamin daily (like Alive Gummy MVI)  + RX vitamin D weekly

## 2023-12-03 ENCOUNTER — Ambulatory Visit (INDEPENDENT_AMBULATORY_CARE_PROVIDER_SITE_OTHER): Admitting: Family Medicine

## 2023-12-24 ENCOUNTER — Encounter: Payer: Self-pay | Admitting: Family Medicine

## 2023-12-24 ENCOUNTER — Ambulatory Visit: Admitting: Family Medicine

## 2023-12-24 VITALS — BP 98/62 | HR 84 | Temp 98.2°F | Ht 66.0 in | Wt 189.0 lb

## 2023-12-24 DIAGNOSIS — E785 Hyperlipidemia, unspecified: Secondary | ICD-10-CM

## 2023-12-24 DIAGNOSIS — E6609 Other obesity due to excess calories: Secondary | ICD-10-CM

## 2023-12-24 DIAGNOSIS — E66811 Obesity, class 1: Secondary | ICD-10-CM | POA: Diagnosis not present

## 2023-12-24 DIAGNOSIS — E559 Vitamin D deficiency, unspecified: Secondary | ICD-10-CM

## 2023-12-24 DIAGNOSIS — Z6830 Body mass index (BMI) 30.0-30.9, adult: Secondary | ICD-10-CM

## 2023-12-24 DIAGNOSIS — I950 Idiopathic hypotension: Secondary | ICD-10-CM | POA: Diagnosis not present

## 2023-12-24 MED ORDER — VITAMIN D (ERGOCALCIFEROL) 1.25 MG (50000 UNIT) PO CAPS
50000.0000 [IU] | ORAL_CAPSULE | ORAL | 0 refills | Status: DC
Start: 2023-12-24 — End: 2024-06-04

## 2023-12-24 NOTE — Patient Instructions (Addendum)
 Try out easy dinners: Salsa chicken (breasts or thighs) with salsa and taco seasoning  Chicken salad or tuna salad- Plain greek yogurt 1/2 light mayo Add to lettuce  Barilla protein pasta + marinara sauce + lean ground beef or ground [place] + broccoli + sprinkle of mozzarella  Progresso Light Soup  + one serving of crackers  Aim for a good 30 min of outdoor walking 3+ days/ wk Plan to start with water exercise starting in June  Keep daily calorie intake 1500 per day This should include ~90 g of protein per day

## 2023-12-24 NOTE — Progress Notes (Signed)
 Office: [phone]  /  Fax: [phone]  WEIGHT SUMMARY AND BIOMETRICS  Starting Date: 11/19/23  Starting Weight: 198lb   Weight Lost Since Last Visit: 2lb   Vitals Temp: 98.2 F (36.8 C) BP: 98/62 Pulse Rate: 84 SpO2: 99 %   Body Composition  Body Fat %: 38.5 % Fat Mass (lbs): 72.8 lbs Muscle Mass (lbs): 110.4 lbs Total Body Water (lbs): 80.2 lbs Visceral Fat Rating : 9    HPI  Chief Complaint: OBESITY  Brandi Phelps is here to discuss her progress with her obesity treatment plan. She is on the the Category 3 Plan and states she is following her eating plan approximately 90 % of the time. She states she is exercising 0 minutes 0 times per week.  Interval History:  Since last office visit she is down 2 lb This gives her a net weight loss of 9 lb in the past month of medically supervised weight management She did enjoy an [place] dinner She does well with breakfast and lunch on plan She is bringing lunch with her to school She struggles with planning dinner, sometimes eating out and cooking Southern meals for her family She is currently doing no regular exercise  Pharmacotherapy: None  PHYSICAL EXAM:  Blood pressure 98/62, pulse 84, temperature 98.2 F (36.8 C), height 5\' 6"  (1.676 m), weight 189 lb (85.7 kg), SpO2 99%, unknown if currently breastfeeding. Body mass index is 30.51 kg/m.  General: She is overweight, cooperative, alert, well developed, and in no acute distress. PSYCH: Has normal mood, affect and thought process.   Lungs: Normal breathing effort, no conversational dyspnea.   ASSESSMENT AND PLAN  TREATMENT PLAN FOR OBESITY:  Recommended Dietary Goals  Brandi Phelps is currently in the action stage of change. As such, her goal is to continue weight management plan. She has agreed to the Category 3 Plan.  Behavioral Intervention  We discussed the following Behavioral Modification Strategies today: increasing lean protein intake to established  goals, increasing fiber rich foods, increasing water intake , work on meal planning and preparation, keeping healthy foods at home, identifying sources and decreasing liquid calories, decreasing eating out or consumption of processed foods, and making healthy choices when eating convenient foods, practice mindfulness eating and understand the difference between hunger signals and cravings, avoiding temptations and identifying enticing environmental cues, planning for success, and continue to work on maintaining a reduced calorie state, getting the recommended amount of protein, incorporating whole foods, making healthy choices, staying well hydrated and practicing mindfulness when eating.. Reviewed dietary change goals get her in after visit summary  Additional resources provided today: NA  Recommended Physical Activity Goals  Brandi Phelps has been advised to work up to 150 minutes of moderate intensity aerobic activity a week and strengthening exercises 2-3 times per week for cardiovascular health, weight loss maintenance and preservation of muscle mass.   She has agreed to Think about enjoyable ways to increase daily physical activity and overcoming barriers to exercise and Start aerobic activity with a goal of 150 minutes a week at moderate intensity.   Pharmacotherapy changes for the treatment of obesity: None  ASSOCIATED CONDITIONS ADDRESSED TODAY  Vitamin D  deficiency Last vitamin D  Lab Results  Component Value Date   VD25OH 22.3 (L) 11/19/2023  She is doing well on vitamin D  50,000 IU once weekly Energy level is starting to improve She is also getting more outdoor sun exposure Repeat vitamin D  level in 2 months  -     Vitamin D  (Ergocalciferol );  Take 1 capsule (50,000 Units total) by mouth every 7 (seven) days.  Dispense: 5 capsule; Refill: 0  Class 1 obesity due to excess calories with body mass index (BMI) of 30.0 to 30.9 in adult, unspecified whether serious comorbidity  present  Hyperlipidemia, unspecified hyperlipidemia type Lab Results  Component Value Date   CHOL 223 (H) 11/19/2023   HDL 77 11/19/2023   LDLCALC 128 (H) 11/19/2023   TRIG 106 11/19/2023   CHOLHDL 2.9 11/19/2023  She is currently not on any lipid-lowering medication. She is doing well on a low saturated fat diet with moderate compliance Has room for increased exercise Follow-up with PCP for management of hyperlipidemia  Idiopathic hypotension Blood pressure on recheck today is slightly better but she still runs a fairly low blood pressure without use of antihypertensive medication.  She notes a long history of hypotension.  She is asymptomatic without a reactive tachycardia.  Recommend 100 ounces of water intake daily, adding 1 sugar-free electrolyte packet to her other to aid in improved blood pressure readings     She was informed of the importance of frequent follow up visits to maximize her success with intensive lifestyle modifications for her multiple health conditions.   ATTESTASTION STATEMENTS:  Reviewed by clinician on day of visit: allergies, medications, problem list, medical history, surgical history, family history, social history, and previous encounter notes pertinent to obesity diagnosis.   I have personally spent 24 minutes total time today in preparation, patient care, nutritional counseling and education,  and documentation for this visit, including the following: review of most recent clinical lab tests, prescribing medications/ refilling medications, reviewing medical assistant documentation, review and interpretation of bioimpedence results.     Brandi Phelps, D.O. DABFM, DABOM [facility] [address] [place], [place] [address] [phone]

## 2024-01-03 ENCOUNTER — Other Ambulatory Visit: Payer: Self-pay | Admitting: Hematology

## 2024-01-03 DIAGNOSIS — Z853 Personal history of malignant neoplasm of breast: Secondary | ICD-10-CM

## 2024-01-14 ENCOUNTER — Ambulatory Visit
Admission: RE | Admit: 2024-01-14 | Discharge: 2024-01-14 | Disposition: A | Discharge status: Home / Self Care | Source: Ambulatory Visit | Attending: Hematology | Admitting: Hematology

## 2024-01-14 DIAGNOSIS — Z853 Personal history of malignant neoplasm of breast: Secondary | ICD-10-CM

## 2024-01-14 MED ORDER — IOPAMIDOL (ISOVUE-370) INJECTION 76%
100.0000 mL | Freq: Once | INTRAVENOUS | Status: AC | PRN
  Administered 2024-01-14: 100 mL via INTRAVENOUS

## 2024-01-21 ENCOUNTER — Encounter: Payer: Self-pay | Admitting: Family Medicine

## 2024-01-21 ENCOUNTER — Ambulatory Visit: Admitting: Family Medicine

## 2024-01-21 VITALS — BP 99/67 | HR 82 | Temp 99.0°F | Ht 66.0 in | Wt 180.0 lb

## 2024-01-21 DIAGNOSIS — E663 Overweight: Secondary | ICD-10-CM

## 2024-01-21 DIAGNOSIS — E559 Vitamin D deficiency, unspecified: Secondary | ICD-10-CM

## 2024-01-21 DIAGNOSIS — Z6829 Body mass index (BMI) 29.0-29.9, adult: Secondary | ICD-10-CM

## 2024-01-21 DIAGNOSIS — E785 Hyperlipidemia, unspecified: Secondary | ICD-10-CM | POA: Diagnosis not present

## 2024-01-21 NOTE — Progress Notes (Signed)
 Office: [phone]  /  Fax: [phone]  WEIGHT SUMMARY AND BIOMETRICS  Starting Date: 11/19/23  Starting Weight: 198lb   Weight Lost Since Last Visit: 9lb   Vitals Temp: 99 F (37.2 C) BP: 99/67 Pulse Rate: 82 SpO2: 99 %   Body Composition  Body Fat %: 36.1 % Fat Mass (lbs): 65 lbs Muscle Mass (lbs): 109.2 lbs Total Body Water (lbs): 74.6 lbs Visceral Fat Rating : 8    HPI  Chief Complaint: OBESITY  Brandi Phelps is here to discuss her progress with her obesity treatment plan. She is on the the Category 3 Plan and states she is following her eating plan approximately 75 % of the time. She states she is exercising 0 minutes 0 times per week.  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, substitute teaching more often She is prioritizing lean protein intake She now has lost 18 lb in the past 2 mos of medically supervised weight management This is a 9% total body weight loss without use of antiobesity medication She is fitting better into her clothes She is driving her daughter to [facility] at night for play practice, limiting her sleep at night Her allergies have been worse and she has felt more tired She still has some hip pain at night from Tamoxifen   Pharmacotherapy: None  PHYSICAL EXAM:  Blood pressure 99/67, pulse 82, temperature 99 F (37.2 C), height 5\' 6"  (1.676 m), weight 180 lb (81.6 kg), SpO2 99%, unknown if currently breastfeeding. Body mass index is 29.05 kg/m.  General: She is healthy appearing, cooperative, alert, well developed, and in no acute distress. PSYCH: Has normal mood, affect and thought process.   Lungs: Normal breathing effort, no conversational dyspnea. Hoarseness of voice  ASSESSMENT AND PLAN  TREATMENT PLAN FOR OBESITY:  Recommended Dietary Goals  Brandi Phelps  is currently in the action stage of change. As such, her goal is to continue weight management plan. She has agreed to keeping a food journal and adhering to recommended goals of 1400 calories and 90 g of protein and practicing portion control and making smarter food choices, such as increasing vegetables and decreasing simple carbohydrates.  Behavioral Intervention  We discussed the following Behavioral Modification Strategies today: increasing lean protein intake to established goals, increasing vegetables, increasing lower glycemic fruits, increasing fiber rich foods, increasing water intake , work on meal planning and preparation, work on Counselling psychologist calories using tracking application, keeping healthy foods at home, practice mindfulness eating and understand the difference between hunger signals and cravings, work on managing stress, creating time for self-care and relaxation, avoiding temptations and identifying enticing environmental cues, and continue to work on maintaining a reduced calorie state, getting the recommended amount of protein, incorporating whole foods, making healthy choices, staying well hydrated and practicing mindfulness when eating..  Additional resources provided today: NA  Recommended Physical Activity Goals  Brandi Phelps has been advised to work up to 150 minutes of moderate intensity aerobic activity a week and strengthening exercises 2-3 times per week for cardiovascular health, weight loss maintenance and preservation of muscle mass.   She has agreed to Start aerobic activity with a goal of 150 minutes a week at moderate intensity.  Plan to add in water exercise or walking after school is out in 2 weeks  Pharmacotherapy changes for the treatment of obesity: None  ASSOCIATED CONDITIONS ADDRESSED TODAY  Vitamin D  deficiency Last vitamin D  Lab Results  Component Value Date   VD25OH 22.3 (L) 11/19/2023  She has been taking vitamin D  50,000 IU once weekly.   She is tolerating this well and has noted continued fatigue with lack of sleep at night.  Recheck vitamin D  level in the next 1-2 mos  Overweight (BMI 25.0-29.9)  BMI 29.0-29.9,adult  Hyperlipidemia, unspecified hyperlipidemia type Lab Results  Component Value Date   CHOL 223 (H) 11/19/2023   HDL 77 11/19/2023   LDLCALC 128 (H) 11/19/2023   TRIG 106 11/19/2023   CHOLHDL 2.9 11/19/2023    She is doing well with weight reduction, and healthy diet and has plans to ramp up levels of physical activity over the next 2 to 3 weeks.  She is currently not on any lipid-lowering medication but has some mild elevation in her LDL levels.  Recheck fasting lipid panel in the next 1 to 2 months   She was informed of the importance of frequent follow up visits to maximize her success with intensive lifestyle modifications for her multiple health conditions.   ATTESTASTION STATEMENTS:  Reviewed by clinician on day of visit: allergies, medications, problem list, medical history, surgical history, family history, social history, and previous encounter notes pertinent to obesity diagnosis.   I have personally spent 30 minutes total time today in preparation, patient care, nutritional counseling and education,  and documentation for this visit, including the following: review of most recent clinical lab tests, reviewing lifestyle change goals on after visit summary reviewing medical assistant documentation, review and interpretation of bioimpedence results.     Micky Albee, D.O. DABFM, DABOM [facility] [address] [place], [place] [address] [phone]

## 2024-01-21 NOTE — Patient Instructions (Addendum)
 Try to track daily intake using the MyNetDiary to track daily intake of food and drink Aim for 1400 cal/ day This should include a good 85-95 g of protein per day Hydrate well with water  Allow 2-3 servings of fresh or frozen fruit per day (any) Allow plenty of non starchy veggies  Aim for 30 min of walking or water walking 3 days/ wk  Aim for a good 8 hrs of sleep at night

## 2024-02-03 ENCOUNTER — Ambulatory Visit
Admission: RE | Admit: 2024-02-03 | Discharge: 2024-02-03 | Disposition: A | Discharge status: Home / Self Care | Source: Ambulatory Visit | Attending: Hematology | Admitting: Hematology

## 2024-02-03 DIAGNOSIS — E2839 Other primary ovarian failure: Secondary | ICD-10-CM

## 2024-02-10 ENCOUNTER — Ambulatory Visit: Attending: Surgery

## 2024-02-10 ENCOUNTER — Ambulatory Visit: Payer: 59

## 2024-02-10 VITALS — Wt 182.4 lb

## 2024-02-10 DIAGNOSIS — Z483 Aftercare following surgery for neoplasm: Secondary | ICD-10-CM | POA: Insufficient documentation

## 2024-02-10 NOTE — Therapy (Signed)
  OUTPATIENT PHYSICAL THERAPY SOZO SCREENING NOTE   Patient Name: Brandi Phelps MRN: [id number] DOB:October 30, 1970, 53 y.o., female Today's Date: 02/10/2024  PCP: Almeda Jacobs, FNP REFERRING PROVIDER: Oza Blumenthal, MD   PT End of Session - 02/10/24 1529     Visit Number 5   # unchanged due to screen only   PT Start Time 1527    PT Stop Time 1531    PT Time Calculation (min) 4 min    Activity Tolerance Patient tolerated treatment well    Behavior During Therapy [facility] for tasks assessed/performed             Past Medical History:  Diagnosis Date   Cancer (HCC) 01/2021   left breast IDC in situ   Complication of anesthesia    Family history of adverse reaction to anesthesia    Family history of colon cancer    Family history of pancreatic cancer    Family history of prostate cancer    Grover's disease    High cholesterol    Joint pain    Vitamin D  deficiency    Past Surgical History:  Procedure Laterality Date   BREAST LUMPECTOMY WITH RADIOACTIVE SEED AND SENTINEL LYMPH NODE BIOPSY Left 02/10/2021   Procedure: LEFT BREAST LUMPECTOMY WITH RADIOACTIVE SEED AND SENTINEL LYMPH NODE BIOPSY;  Surgeon: Oza Blumenthal, MD;  Location:  SURGERY CENTER;  Service: General;  Laterality: Left;   CESAREAN SECTION     x3   Patient Active Problem List   Diagnosis Date Noted   HLD (hyperlipidemia) 12/02/2023   History of left breast cancer 11/19/2023   Perimenopausal 11/19/2023   Bilateral hip pain 11/19/2023   Family history of prostate cancer    Family history of colon cancer    Family history of pancreatic cancer    Malignant neoplasm of upper-outer quadrant of left breast in female, estrogen receptor positive (HCC) 01/10/2021    REFERRING DIAG: left breast cancer at risk for lymphedema  THERAPY DIAG: Aftercare following surgery for neoplasm  PERTINENT HISTORY: Patient was diagnosed on 12/19/2020 with 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  She underwent a left lumpcetomy and sentinel node biopsy (3 negative nodes) on 02/10/2021. It is ER/PR positive and HER2 negative with a Ki67 of 10%. Radiation completed.  Taking tamoxifen    PRECAUTIONS: left UE Lymphedema risk, None  SUBJECTIVE: Pt returns for her 6 month L-Dex screen.   PAIN:  Are you having pain? No  SOZO SCREENING: Patient was assessed today using the SOZO machine to determine the lymphedema index score. This was compared to her baseline score. It was determined that she is within the recommended range when compared to her baseline and no further action is needed at this time. She will continue SOZO screenings. These are done every 3 months for 2 years post operatively followed by every 6 months for 2 years, and then annually.   L-DEX FLOWSHEETS - 02/10/24 1500       L-DEX LYMPHEDEMA SCREENING   Measurement Type Unilateral    L-DEX MEASUREMENT EXTREMITY Upper Extremity    POSITION  Standing    DOMINANT SIDE Right    At Risk Side Left    BASELINE SCORE (UNILATERAL) -0.6    L-DEX SCORE (UNILATERAL) -0.2    VALUE CHANGE (UNILAT) 0.4             P: Cont 6 month L-Dex screens next.   Denyce Flank, PTA 02/10/2024, 3:30 PM

## 2024-02-24 ENCOUNTER — Ambulatory Visit: Admitting: Family Medicine

## 2024-03-10 ENCOUNTER — Ambulatory Visit: Admitting: Family Medicine

## 2024-03-10 ENCOUNTER — Encounter: Payer: Self-pay | Admitting: Family Medicine

## 2024-03-10 VITALS — BP 107/74 | HR 71 | Temp 98.3°F | Ht 66.0 in | Wt 173.0 lb

## 2024-03-10 DIAGNOSIS — Z853 Personal history of malignant neoplasm of breast: Secondary | ICD-10-CM

## 2024-03-10 DIAGNOSIS — E663 Overweight: Secondary | ICD-10-CM | POA: Diagnosis not present

## 2024-03-10 DIAGNOSIS — M25551 Pain in right hip: Secondary | ICD-10-CM

## 2024-03-10 DIAGNOSIS — E559 Vitamin D deficiency, unspecified: Secondary | ICD-10-CM | POA: Diagnosis not present

## 2024-03-10 DIAGNOSIS — M25552 Pain in left hip: Secondary | ICD-10-CM

## 2024-03-10 DIAGNOSIS — Z6827 Body mass index (BMI) 27.0-27.9, adult: Secondary | ICD-10-CM

## 2024-03-10 NOTE — Progress Notes (Signed)
 Office: [phone]  /  Fax: [phone]  WEIGHT SUMMARY AND BIOMETRICS  Starting Date: 11/19/23  Starting Weight: 198lb   Weight Lost Since Last Visit: 7lb   Vitals Temp: 98.3 F (36.8 C) BP: 107/74 Pulse Rate: 71 SpO2: 100 %   Body Composition  Body Fat %: 35.2 % Fat Mass (lbs): 61.2 lbs Muscle Mass (lbs): 106.8 lbs Total Body Water (lbs): 75.8 lbs Visceral Fat Rating : 8   HPI  Chief Complaint: OBESITY  Brandi Phelps is here to discuss her progress with her obesity treatment plan. She is on the the Category 3 Plan and states she is following her eating plan approximately 70 % of the time. She states she is exercising 30+ minutes 3-5 times per week.  Interval History:  Since last office visit she is down 7 lb This gives her a net weight loss of 25 lb in 3.5 mos of medically supervised weight management This is a 12.6% total body weight loss without use of antiobesity medication She just got back from a trip to the beach She continues to be mindful of her food choices, meal planning and portion sizes. Her family has been supportive She did increase her walking time and has had more bilateral hip pain, related to use of tamoxifen  She did add in some water walking which has relieved some of her hip pain and allow her to exercise more She has had some feelings of increased hunger and fatigue  Pharmacotherapy: None  PHYSICAL EXAM:  Blood pressure 107/74, pulse 71, temperature 98.3 F (36.8 C), height 5' 6 (1.676 m), weight 173 lb (78.5 kg), SpO2 100%, unknown if currently breastfeeding. Body mass index is 27.92 kg/m.  General: She is healthy appearing, cooperative, alert, well developed, and in no acute distress. PSYCH: Has normal mood, affect and thought process.   Lungs: Normal breathing effort, no conversational dyspnea.   ASSESSMENT AND PLAN  TREATMENT PLAN FOR OBESITY:  Recommended Dietary Goals  Brandi Phelps is currently in the action stage of change.  As such, her goal is to continue weight management plan. She has agreed to practicing portion control and making smarter food choices, such as increasing vegetables and decreasing simple carbohydrates. - Recommend adding in a carb serving to each meal - Increase fruit to 2 servings per day, any fresh or frozen -Allow more nonstarchy vegetables throughout the day  Behavioral Intervention  We discussed the following Behavioral Modification Strategies today: increasing vegetables, increasing fiber rich foods, increasing water intake , keeping healthy foods at home, work on managing stress, creating time for self-care and relaxation, avoiding temptations and identifying enticing environmental cues, continue to practice mindfulness when eating, planning for success, and continue to work on maintaining a reduced calorie state, getting the recommended amount of protein, incorporating whole foods, making healthy choices, staying well hydrated and practicing mindfulness when eating..  Additional resources provided today: NA  Recommended Physical Activity Goals  Brandi Phelps has been advised to work up to 150 minutes of moderate intensity aerobic activity a week and strengthening exercises 2-3 times per week for cardiovascular health, weight loss maintenance and preservation of muscle mass.   She has agreed to [employer] strengthening exercises with a goal of 2-3 sessions a week  and Increase the intensity, frequency or duration of aerobic exercises   - Add in resistance training from home 2 to 3 days a week -Aim for walking or water exercise 3 days a week  Pharmacotherapy changes for the treatment of obesity: None  ASSOCIATED  CONDITIONS ADDRESSED TODAY  Vitamin D  deficiency Last vitamin D  Lab Results  Component Value Date   VD25OH 22.3 (L) 11/19/2023  She has been taking vitamin D  50,000 IU once weekly.  Energy level is improving.  Repeat vitamin D  level today  -     VITAMIN D  25 Hydroxy (Vit-D  Deficiency, Fractures)  Overweight (BMI 25.0-29.9) Improving.  Reviewed patient's overall progress including bioimpedance results.  She feels ready to add in resistance training exercise 2 days a week to minimize further muscle loss. Adjustments made to her overall food plan as recommended above  BMI 27.0-27.9,adult  History of left breast cancer She is doing great reducing body fat percent to reduce her risk of future weight related cancers.  She has had some associated bilateral hip pain even on a low-dose of tamoxifen .  Follow-up with oncology and continue current medications.  Bilateral hip pain Worsened by increasing walking time especially with walking on the beach.  She feels that her low-dose tamoxifen  has also contributed to her hip pain.  She is to follow-up with oncology for any associated side effects.  Recommend adding in more water exercise over the summer months to alleviate some of her hip pain caused by outdoor walking.   She was informed of the importance of frequent follow up visits to maximize her success with intensive lifestyle modifications for her multiple health conditions.   ATTESTASTION STATEMENTS:  Reviewed by clinician on day of visit: allergies, medications, problem list, medical history, surgical history, family history, social history, and previous encounter notes pertinent to obesity diagnosis.   I have personally spent 30 minutes total time today in preparation, patient care, nutritional counseling and education,  and documentation for this visit, including the following: review of most recent clinical lab tests, prescribing medications/ refilling medications, reviewing medical assistant documentation, review and interpretation of bioimpedence results.     Darice Haddock, D.O. DABFM, DABOM [facility] [address] [place], [place] [address] [phone]

## 2024-03-10 NOTE — Patient Instructions (Addendum)
 Allow 1 carb with each meal: High fiber bread/ potatoes/ fruit/ rice/ beans/ oatmeal/ [employer] Bagel  Fruit: 2 serving per day of any fresh or frozen fruit  Load up on non starchy veggies Remember your lean protein with your meals  Net weight loss: 25 lb in 3.5 mos  Avocado oil plant based butter Unsweetened almond milk

## 2024-03-11 LAB — VITAMIN D 25 HYDROXY (VIT D DEFICIENCY, FRACTURES): Vit D, 25-Hydroxy: 57.4 ng/mL (ref 30.0–100.0)

## 2024-03-16 ENCOUNTER — Ambulatory Visit: Payer: Self-pay | Admitting: Family Medicine

## 2024-04-13 ENCOUNTER — Inpatient Hospital Stay (HOSPITAL_BASED_OUTPATIENT_CLINIC_OR_DEPARTMENT_OTHER): Payer: Self-pay | Admitting: Adult Health

## 2024-04-13 ENCOUNTER — Encounter: Payer: Self-pay | Admitting: Adult Health

## 2024-04-13 ENCOUNTER — Inpatient Hospital Stay: Payer: BC Managed Care – PPO | Attending: Adult Health

## 2024-04-13 ENCOUNTER — Other Ambulatory Visit: Payer: Self-pay

## 2024-04-13 VITALS — BP 100/70 | HR 70 | Temp 98.3°F | Resp 18 | Ht 66.0 in | Wt 177.2 lb

## 2024-04-13 DIAGNOSIS — C50412 Malignant neoplasm of upper-outer quadrant of left female breast: Secondary | ICD-10-CM | POA: Insufficient documentation

## 2024-04-13 DIAGNOSIS — M25551 Pain in right hip: Secondary | ICD-10-CM | POA: Diagnosis not present

## 2024-04-13 DIAGNOSIS — Z79810 Long term (current) use of selective estrogen receptor modulators (SERMs): Secondary | ICD-10-CM | POA: Diagnosis not present

## 2024-04-13 DIAGNOSIS — Z800 Family history of malignant neoplasm of digestive organs: Secondary | ICD-10-CM | POA: Diagnosis not present

## 2024-04-13 DIAGNOSIS — Z8042 Family history of malignant neoplasm of prostate: Secondary | ICD-10-CM | POA: Insufficient documentation

## 2024-04-13 DIAGNOSIS — Z170 Estrogen receptor positive status [ER+]: Secondary | ICD-10-CM

## 2024-04-13 DIAGNOSIS — G8929 Other chronic pain: Secondary | ICD-10-CM | POA: Insufficient documentation

## 2024-04-13 DIAGNOSIS — M25552 Pain in left hip: Secondary | ICD-10-CM | POA: Insufficient documentation

## 2024-04-13 DIAGNOSIS — Z79899 Other long term (current) drug therapy: Secondary | ICD-10-CM | POA: Diagnosis not present

## 2024-04-13 DIAGNOSIS — Z87891 Personal history of nicotine dependence: Secondary | ICD-10-CM | POA: Insufficient documentation

## 2024-04-13 LAB — CBC WITH DIFFERENTIAL/PLATELET
Abs Immature Granulocytes: 0.01 K/uL (ref 0.00–0.07)
Basophils Absolute: 0.1 K/uL (ref 0.0–0.1)
Basophils Relative: 1 %
Eosinophils Absolute: 0.2 K/uL (ref 0.0–0.5)
Eosinophils Relative: 5 %
HCT: 37.1 % (ref 36.0–46.0)
Hemoglobin: 12.3 g/dL (ref 12.0–15.0)
Immature Granulocytes: 0 %
Lymphocytes Relative: 34 %
Lymphs Abs: 1.6 K/uL (ref 0.7–4.0)
MCH: 30.1 pg (ref 26.0–34.0)
MCHC: 33.2 g/dL (ref 30.0–36.0)
MCV: 90.9 fL (ref 80.0–100.0)
Monocytes Absolute: 0.4 K/uL (ref 0.1–1.0)
Monocytes Relative: 9 %
Neutro Abs: 2.3 K/uL (ref 1.7–7.7)
Neutrophils Relative %: 51 %
Platelets: 237 K/uL (ref 150–400)
RBC: 4.08 MIL/uL (ref 3.87–5.11)
RDW: 12.4 % (ref 11.5–15.5)
WBC: 4.5 K/uL (ref 4.0–10.5)
nRBC: 0 % (ref 0.0–0.2)

## 2024-04-13 LAB — COMPREHENSIVE METABOLIC PANEL WITH GFR
ALT: 10 U/L (ref 0–44)
AST: 14 U/L — ABNORMAL LOW (ref 15–41)
Albumin: 4.2 g/dL (ref 3.5–5.0)
Alkaline Phosphatase: 60 U/L (ref 38–126)
Anion gap: 5 (ref 5–15)
BUN: 13 mg/dL (ref 6–20)
CO2: 30 mmol/L (ref 22–32)
Calcium: 9 mg/dL (ref 8.9–10.3)
Chloride: 106 mmol/L (ref 98–111)
Creatinine, Ser: 0.62 mg/dL (ref 0.44–1.00)
GFR, Estimated: >60 mL/min (ref >60)
Glucose, Bld: 88 mg/dL (ref 70–99)
Potassium: 4.2 mmol/L (ref 3.5–5.1)
Sodium: 141 mmol/L (ref 135–145)
Total Bilirubin: 0.4 mg/dL (ref 0.0–1.2)
Total Protein: 6.6 g/dL (ref 6.5–8.1)

## 2024-04-13 NOTE — Progress Notes (Signed)
 [facility] Cancer Follow up:    Brandi Perkins, FNP [address] [place] [address]   DIAGNOSIS:  Cancer Staging  Malignant neoplasm of upper-outer quadrant of left breast in female, estrogen receptor positive (HCC) Staging form: Breast, AJCC 8th Edition - Clinical stage from 01/11/2021: Stage IA (cT1b, cN0, cM0, G2, ER+, PR+, HER2-) - Signed by Lanny Callander, MD on 01/11/2021 Stage prefix: Initial diagnosis Histologic grading system: 3 grade system Laterality: Left Staged by: Pathologist and managing physician Stage used in treatment planning: Yes National guidelines used in treatment planning: Yes Type of national guideline used in treatment planning: NCCN - Pathologic stage from 02/10/2021: Stage IA (pT1b, pN0, cM0, G2, ER+, PR+, HER2-) - Signed by Lanny Callander, MD on 04/12/2021 Stage prefix: Initial diagnosis Histologic grading system: 3 grade system    SUMMARY OF ONCOLOGIC HISTORY: Oncology History Overview Note  Cancer Staging Malignant neoplasm of upper-outer quadrant of left breast in female, estrogen receptor positive (HCC) Staging form: Breast, AJCC 8th Edition - Clinical stage from 01/11/2021: Stage IA (cT1b, cN0, cM0, G2, ER+, PR+, HER2-) - Signed by Lanny Callander, MD on 01/11/2021 Stage prefix: Initial diagnosis Histologic grading system: 3 grade system Laterality: Left Staged by: Pathologist and managing physician Stage used in treatment planning: Yes National guidelines used in treatment planning: Yes Type of national guideline used in treatment planning: NCCN - Pathologic stage from 02/10/2021: Stage IA (pT1b, pN0, cM0, G2, ER+, PR+, HER2-) - Signed by Lanny Callander, MD on 04/12/2021 Stage prefix: Initial diagnosis Histologic grading system: 3 grade system    Malignant neoplasm of upper-outer quadrant of left breast in female, estrogen receptor positive (HCC)  01/05/2021 Initial Biopsy   Diagnosis Breast, left, needle core biopsy, upper  outer - INVASIVE DUCTAL CARCINOMA, GRADE 1/2. - DUCTAL CARCINOMA IN SITU. Microscopic Comment The greatest tumor dimension is 0.8 cm. A breast prognostic profile will be performed. Dr. Alvaro agrees.   01/05/2021 Receptors her2   PROGNOSTIC INDICATORS Results: IMMUNOHISTOCHEMICAL AND MORPHOMETRIC ANALYSIS PERFORMED MANUALLY The tumor cells are NEGATIVE for Her2 (1+). Estrogen Receptor: 85%, POSITIVE, MODERATE STAINING INTENSITY Progesterone Receptor: 95%, POSITIVE, STRONG STAINING INTENSITY Proliferation Marker Ki67: 10%   01/05/2021 Mammogram   Mammogram  Left breast mass 1:30 position   01/10/2021 Initial Diagnosis   Malignant neoplasm of upper-outer quadrant of left breast in female, estrogen receptor positive (HCC)   01/11/2021 Cancer Staging   Staging form: Breast, AJCC 8th Edition - Clinical stage from 01/11/2021: Stage IA (cT1b, cN0, cM0, G2, ER+, PR+, HER2-) - Signed by Lanny Callander, MD on 01/11/2021 Stage prefix: Initial diagnosis Histologic grading system: 3 grade system Laterality: Left Staged by: Pathologist and managing physician Stage used in treatment planning: Yes National guidelines used in treatment planning: Yes Type of national guideline used in treatment planning: NCCN   02/10/2021 Cancer Staging   Staging form: Breast, AJCC 8th Edition - Pathologic stage from 02/10/2021: Stage IA (pT1b, pN0, cM0, G2, ER+, PR+, HER2-) - Signed by Lanny Callander, MD on 04/12/2021 Stage prefix: Initial diagnosis Histologic grading system: 3 grade system   02/10/2021 Pathology Results   FINAL MICROSCOPIC DIAGNOSIS:   A. 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 BREAST, LEFT NEW MEDIAL/INFERIOR  MARGIN, EXCISION:  -  No residual carcinoma identified   C.  LYMPH NODE, LEFT AXILLARY, SENTINEL, EXCISION:  -  No carcinoma identified in one lymph node (0/1)   D. LYMPH NODE, LEFT AXILLARY, SENTINEL, EXCISION:  -  No carcinoma identified in one lymph node (0/1)   E. LYMPH NODE, LEFT AXILLARY, SENTINEL, EXCISION:  -  No carcinoma identified in one lymph node (0/1)    02/10/2021 Oncotype testing   Recurrence score of 6, risk of distant metastasis 3%, no benefit from chemo   03/27/2021 - 04/24/2021 Radiation Therapy   Under Dr. Dewey     CURRENT THERAPY: Tamoxifen   INTERVAL HISTORY: Discussed the use of AI scribe software for clinical note transcription with the patient, who gave verbal consent to proceed.  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 She has been on tamoxifen , initially experiencing neck spasms and sharp pains radiating to the top of her head, which improved after reducing the dose to 10 mg. However, she continues to experience neck tightness and popping, especially when driving.  Significant hip pain, particularly at night, worsened with tamoxifen  use. The pain improved when tamoxifen  was temporarily stopped, but discomfort persists after physical activity, such as walking. She has lost approximately 30 pounds through a weight management program, alleviating some symptoms like snoring, but not the hip pain.  She has not had a menstrual period in over a year and is awaiting lab results to confirm postmenopausal status, which may influence her antiestrogen therapy. She prefers letrozole  over tamoxifen  due to better tolerance.  No new breast changes or concerns since her last normal mammogram in May. She occasionally experiences numbness and itching at the site of her  previous breast surgery.     Patient Active Problem List   Diagnosis Date Noted   HLD (hyperlipidemia) 12/02/2023   History of left breast cancer 11/19/2023   Perimenopausal 11/19/2023   Bilateral hip pain 11/19/2023   Family history of prostate cancer    Family history of colon cancer    Family history of pancreatic cancer    Malignant neoplasm of upper-outer quadrant of left breast in female, estrogen receptor positive (HCC) 01/10/2021    is allergic to methotrexate derivatives and diflucan  [fluconazole ].  MEDICAL HISTORY: Past Medical History:  Diagnosis Date   Cancer (HCC) 01/2021   left breast IDC in situ   Complication of anesthesia    Family history of adverse reaction to anesthesia    Family history of colon cancer    Family history of pancreatic cancer    Family history of prostate cancer    Grover's disease    High cholesterol    Joint pain    Vitamin D  deficiency     SURGICAL HISTORY: Past Surgical History:  Procedure Laterality Date   BREAST LUMPECTOMY WITH RADIOACTIVE SEED AND SENTINEL LYMPH NODE BIOPSY Left 02/10/2021   Procedure: LEFT BREAST LUMPECTOMY WITH RADIOACTIVE SEED AND SENTINEL LYMPH NODE BIOPSY;  Surgeon: Vernetta Berg, MD;  Location:  SURGERY CENTER;  Service: General;  Laterality: Left;   CESAREAN SECTION     x3    SOCIAL HISTORY: Social History   Socioeconomic History   Marital status: Married    Spouse name: Not on file   Number  of children: Not on file   Years of education: Not on file   Highest education level: Not on file  Occupational History   Not on file  Tobacco Use   Smoking status: Former    Current packs/day: 0.00    Average packs/day: 0.5 packs/day for 5.0 years (2.5 ttl pk-yrs)    Types: Cigarettes    Start date: 01/11/1989    Quit date: 01/11/1994    Years since quitting: 30.2   Smokeless tobacco: Never  Substance and Sexual Activity   Alcohol use: Not Currently   Drug use: Never   Sexual activity:  Yes    Birth control/protection: Condom  Other Topics Concern   Not on file  Social History Narrative   Not on file   Social Drivers of Health   Financial Resource Strain: Not on file  Food Insecurity: Not on file  Transportation Needs: Not on file  Physical Activity: Not on file  Stress: Not on file  Social Connections: Not on file  Intimate Partner Violence: Not on file    FAMILY HISTORY: Family History  Problem Relation Age of Onset   Stroke Mother    Hyperlipidemia Mother    Hypertension Mother    Depression Mother    Anxiety disorder Mother    Cancer Father    Diabetes Father    Prostate cancer Father 54   Myelodysplastic syndrome Father 78   Cancer Paternal Grandfather        mouth or throat, dx 70s/80s, smoker   Colon cancer Maternal Uncle        dx 56s   Pancreatic cancer Maternal Uncle        dx 92s   Prostate cancer Paternal Uncle     Review of Systems  Constitutional:  Negative for appetite change, chills, fatigue, fever and unexpected weight change.  HENT:   Negative for hearing loss, lump/mass and trouble swallowing.   Eyes:  Negative for eye problems and icterus.  Respiratory:  Negative for chest tightness, cough and shortness of breath.   Cardiovascular:  Negative for chest pain, leg swelling and palpitations.  Gastrointestinal:  Negative for abdominal distention, abdominal pain, constipation, diarrhea, nausea and vomiting.  Endocrine: Negative for hot flashes.  Genitourinary:  Negative for difficulty urinating.   Musculoskeletal:  Positive for arthralgias.  Skin:  Negative for itching and rash.  Neurological:  Negative for dizziness, extremity weakness, headaches and numbness.  Hematological:  Negative for adenopathy. Does not bruise/bleed easily.  Psychiatric/Behavioral:  Negative for depression. The patient is not nervous/anxious.       PHYSICAL EXAMINATION   Onc Performance Status - 04/13/24 1213       ECOG Perf Status   ECOG Perf  Status Restricted in physically strenuous activity but ambulatory and able to carry out work of a light or sedentary nature, e.g., light house work, office work      KPS SCALE   KPS % SCORE Able to carry on normal activity, minor s/s of disease          Vitals:   04/13/24 1211  BP: 100/70  Pulse: 70  Resp: 18  Temp: 98.3 F (36.8 C)  SpO2: 100%    Physical Exam Constitutional:      General: She is not in acute distress.    Appearance: Normal appearance. She is not toxic-appearing.  HENT:     Head: Normocephalic and atraumatic.     Mouth/Throat:     Mouth: Mucous membranes are moist.  Pharynx: Oropharynx is clear. No oropharyngeal exudate or posterior oropharyngeal erythema.  Eyes:     General: No scleral icterus. Cardiovascular:     Rate and Rhythm: Normal rate and regular rhythm.     Pulses: Normal pulses.     Heart sounds: Normal heart sounds.  Pulmonary:     Effort: Pulmonary effort is normal.     Breath sounds: Normal breath sounds.  Chest:     Comments: Left breast s/p lumpectomy and radiation, no sign of local recurrence, right breast benign Abdominal:     General: Abdomen is flat. Bowel sounds are normal. There is no distension.     Palpations: Abdomen is soft.     Tenderness: There is no abdominal tenderness.  Musculoskeletal:        General: No swelling.     Cervical back: Neck supple.  Lymphadenopathy:     Cervical: No cervical adenopathy.     Upper Body:     Right upper body: No supraclavicular or axillary adenopathy.     Left upper body: No supraclavicular or axillary adenopathy.  Skin:    General: Skin is warm and dry.     Findings: No rash.  Neurological:     General: No focal deficit present.     Mental Status: She is alert.  Psychiatric:        Mood and Affect: Mood normal.        Behavior: Behavior normal.     LABORATORY DATA:  CBC    Component Value Date/Time   WBC 4.5 04/13/2024 1150   RBC 4.08 04/13/2024 1150   HGB 12.3  04/13/2024 1150   HGB 13.0 10/10/2023 1051   HCT 37.1 04/13/2024 1150   PLT 237 04/13/2024 1150   PLT 265 10/10/2023 1051   MCV 90.9 04/13/2024 1150   MCH 30.1 04/13/2024 1150   MCHC 33.2 04/13/2024 1150   RDW 12.4 04/13/2024 1150   LYMPHSABS 1.6 04/13/2024 1150   MONOABS 0.4 04/13/2024 1150   EOSABS 0.2 04/13/2024 1150   BASOSABS 0.1 04/13/2024 1150    CMP     Component Value Date/Time   NA 141 04/13/2024 1150   NA 143 11/19/2023 0941   K 4.2 04/13/2024 1150   CL 106 04/13/2024 1150   CO2 30 04/13/2024 1150   GLUCOSE 88 04/13/2024 1150   BUN 13 04/13/2024 1150   BUN 10 11/19/2023 0941   CREATININE 0.62 04/13/2024 1150   CREATININE 0.61 10/10/2023 1051   CALCIUM 9.0 04/13/2024 1150   PROT 6.6 04/13/2024 1150   PROT 6.8 11/19/2023 0941   ALBUMIN 4.2 04/13/2024 1150   ALBUMIN 4.5 11/19/2023 0941   AST 14 (L) 04/13/2024 1150   AST 13 (L) 10/10/2023 1051   ALT 10 04/13/2024 1150   ALT 11 10/10/2023 1051   ALKPHOS 60 04/13/2024 1150   BILITOT 0.4 04/13/2024 1150   BILITOT 0.3 11/19/2023 0941   BILITOT 0.3 10/10/2023 1051   GFRNONAA >60 04/13/2024 1150   GFRNONAA >60 10/10/2023 1051     ASSESSMENT and THERAPY PLAN:   Assessment and Plan Assessment & Plan History of left breast invasive ductal carcinoma (ER/PR+, stage IA), status post lumpectomy, adjuvant radiation, and antiestrogen therapy Stage IA ER/PR positive left breast invasive ductal carcinoma, post-treatment with lumpectomy, radiation, and ongoing antiestrogen therapy. No current breast changes. Surgical site numbness and itching likely due to scar tissue. - Continue tamoxifen  10 mg daily. - Order FSH and estradiol  levels to evaluate menopausal status. - Consider switching  to letrozole  if postmenopausal and tolerated better. - Continue healthy diet and exercise.  Antiestrogen therapy management for breast cancer (tamoxifen  vs letrozole ) and menopausal status evaluation Currently on tamoxifen  10 mg daily  with neck spasms and hip pain improved by dose reduction. Menopausal status affects therapy choice. Estradiol  and FSH levels ordered to confirm postmenopausal status. Consider letrozole  if postmenopausal and better tolerated. - Continue tamoxifen  10 mg daily. - Order FSH and estradiol  levels. - Consider switching to letrozole  if postmenopausal.  Bilateral hip pain Chronic bilateral hip pain, worsens at night and after activity. Pain improved with tamoxifen  cessation, suggesting a link. Weight loss achieved but pain persists. Tylenol  and Advil ineffective. Possible tendon involvement. - Consider referral to sports medicine for evaluation and management.    All questions were answered. The patient knows to call the clinic with any problems, questions or concerns. We can certainly see the patient much sooner if necessary.  Total encounter time:30 minutes*in face-to-face visit time, chart review, lab review, care coordination, order entry, and documentation of the encounter time.    Morna Kendall, NP 04/13/24 1:09 PM Medical Oncology and Hematology [facility] [address] [place], [place] [address] Tel. [phone]    Fax. [phone]  *Total Encounter Time as defined by the Centers for Medicare and Medicaid Services includes, in addition to the face-to-face time of a patient visit (documented in the note above) non-face-to-face time: obtaining and reviewing outside history, ordering and reviewing medications, tests or procedures, care coordination (communications with other health care professionals or caregivers) and documentation in the medical record.

## 2024-04-14 LAB — FOLLICLE STIMULATING HORMONE: FSH: 25.1 m[IU]/mL

## 2024-04-17 LAB — ESTRADIOL, ULTRA SENS: Estradiol, Sensitive: 3.1 pg/mL

## 2024-04-20 ENCOUNTER — Ambulatory Visit: Admitting: Family Medicine

## 2024-04-30 ENCOUNTER — Other Ambulatory Visit: Payer: Self-pay | Admitting: Hematology

## 2024-05-27 ENCOUNTER — Ambulatory Visit: Admitting: Family Medicine

## 2024-05-31 ENCOUNTER — Other Ambulatory Visit: Payer: Self-pay | Admitting: Hematology

## 2024-06-04 ENCOUNTER — Ambulatory Visit: Admitting: Family Medicine

## 2024-06-04 ENCOUNTER — Encounter: Payer: Self-pay | Admitting: Family Medicine

## 2024-06-04 VITALS — BP 109/76 | HR 81 | Temp 98.6°F | Ht 66.0 in | Wt 173.0 lb

## 2024-06-04 DIAGNOSIS — E663 Overweight: Secondary | ICD-10-CM

## 2024-06-04 DIAGNOSIS — M25551 Pain in right hip: Secondary | ICD-10-CM

## 2024-06-04 DIAGNOSIS — M25552 Pain in left hip: Secondary | ICD-10-CM | POA: Diagnosis not present

## 2024-06-04 DIAGNOSIS — Z6827 Body mass index (BMI) 27.0-27.9, adult: Secondary | ICD-10-CM

## 2024-06-04 DIAGNOSIS — E559 Vitamin D deficiency, unspecified: Secondary | ICD-10-CM

## 2024-06-04 NOTE — Patient Instructions (Signed)
 Vitamin D  2,000 international units  daily for maintenance  Aim for 45 min of walking 4 days/ wk  Do you the best you can with eating out Keep fast food dinner 450-550 calories  (Grilled chicken, salads, etc)

## 2024-06-04 NOTE — Progress Notes (Signed)
 Office: [phone]  /  Fax: [phone]  WEIGHT SUMMARY AND BIOMETRICS  Starting Date: 11/19/23  Starting Weight: 198lb   Weight Lost Since Last Visit: 0lb   Vitals Temp: 98.6 F (37 C) BP: 109/76 Pulse Rate: 81 SpO2: 100 %   Body Composition  Body Fat %: 36 % Fat Mass (lbs): 62.4 lbs Muscle Mass (lbs): 105.4 lbs Total Body Water (lbs): 74.8 lbs Visceral Fat Rating : 8   HPI  Chief Complaint: OBESITY  Amita is here to discuss her progress with her obesity treatment plan. She is on the practicing portion control and making smarter food choices, such as increasing vegetables and decreasing simple carbohydrates and states she is following her eating plan approximately 50 % of the time. She states she is exercising 30 minutes 1-2 times per week.  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 Body mass index is 27.92 kg/m.  General: She is healthy appearing, cooperative, alert, well developed, and in no acute distress. PSYCH: Has normal mood, affect and thought process.   Lungs: Normal breathing effort, no conversational dyspnea.  ASSESSMENT AND PLAN  TREATMENT PLAN FOR OBESITY:  Recommended Dietary Goals  Kambrie is currently in the action stage of change. As such, her goal is to continue weight management plan. She has agreed  to keeping a food journal and adhering to recommended goals of 1500 calories and 85 g of  protein and practicing portion control and making smarter food choices, such as increasing vegetables and decreasing simple carbohydrates.  Behavioral Intervention  We discussed the following Behavioral Modification Strategies today: increasing lean protein intake to established goals, increasing fiber rich foods, increasing water intake , work on meal planning and preparation, keeping healthy foods at home, identifying sources and decreasing liquid calories, continue to practice mindfulness when eating, and planning for success.  Additional resources provided today: NA  Recommended Physical Activity Goals  Stephonie has been advised to work up to 150 minutes of moderate intensity aerobic activity a week and strengthening exercises 2-3 times per week for cardiovascular health, weight loss maintenance and preservation of muscle mass.   She has agreed to Start aerobic activity with a goal of 150 minutes a week at moderate intensity.   Pharmacotherapy changes for the treatment of obesity: none  ASSOCIATED CONDITIONS ADDRESSED TODAY  Vitamin D  deficiency Last vitamin D  Lab Results  Component Value Date   VD25OH 57.4 03/10/2024  She has completed treatment of vitamin D  50,000 international units  weekly x 3 mos and energy level has improved.  She is taking a MVI with vitamin D  in it.  Reviewed lab results with patient from last visit  Overweight (BMI 25.0-29.9) Improving.  She has been able to maintain her weight loss of 25 pounds in the past 5+ months of medically supervised weight management without use  of antiobesity medication.  She has been able to keep her BMI under 30.  She has room for improvement with increasing walking time, limited by time between work and family.  She does plan to increase her walking time and continue to work on [employer].  She would like to lose about 10 more pounds  by the end of the year.  BMI 27.0-27.9,adult  Bilateral hip pain Stable.  She has some bilateral hip pain related to tamoxifen  use.  She has about 2 more years of tamoxifen  status post breast cancer.  She has been able to do 2 to 3 miles of walking without exacerbation of pain.     She was informed of the importance of frequent follow up visits to maximize her success with intensive lifestyle modifications for her multiple health conditions.   ATTESTASTION STATEMENTS:  Reviewed by clinician on day of visit: allergies, medications, problem list, medical history, surgical history, family history, social history, and previous encounter notes pertinent to obesity diagnosis.       Darice Haddock, D.O. DABFM, DABOM [facility] [address] [place], [place] [address] [phone]

## 2024-07-16 ENCOUNTER — Ambulatory Visit: Admitting: Family Medicine

## 2024-08-10 ENCOUNTER — Ambulatory Visit: Attending: Surgery

## 2024-08-10 DIAGNOSIS — Z483 Aftercare following surgery for neoplasm: Secondary | ICD-10-CM | POA: Insufficient documentation

## 2024-08-12 ENCOUNTER — Ambulatory Visit: Admitting: Family Medicine

## 2024-08-18 ENCOUNTER — Other Ambulatory Visit: Payer: Self-pay | Admitting: Nurse Practitioner

## 2024-09-12 ENCOUNTER — Other Ambulatory Visit: Payer: Self-pay | Admitting: Hematology

## 2024-10-14 ENCOUNTER — Inpatient Hospital Stay

## 2024-10-14 ENCOUNTER — Inpatient Hospital Stay: Admitting: Hematology

## 2025-04-15 ENCOUNTER — Other Ambulatory Visit

## 2025-04-15 ENCOUNTER — Ambulatory Visit: Admitting: Adult Health
